# Patient Record
Sex: Female | Born: 1985 | Race: White | Hispanic: No | Marital: Married | Smoking: Never smoker
Health system: Southern US, Community
[De-identification: ages and names within clinical notes are randomized; demographics above are authoritative.]

## PROBLEM LIST (undated history)

## (undated) DIAGNOSIS — D6852 Prothrombin gene mutation: Secondary | ICD-10-CM

## (undated) HISTORY — PX: TONSILLECTOMY: SUR1361

## (undated) HISTORY — PX: ARTHROSCOPIC REPAIR ACL: SUR80

## (undated) HISTORY — PX: FOOT SURGERY: SHX648

---

## 2018-01-09 DIAGNOSIS — Z30431 Encounter for routine checking of intrauterine contraceptive device: Secondary | ICD-10-CM | POA: Diagnosis not present

## 2018-01-25 DIAGNOSIS — F430 Acute stress reaction: Secondary | ICD-10-CM | POA: Diagnosis not present

## 2018-02-06 DIAGNOSIS — F430 Acute stress reaction: Secondary | ICD-10-CM | POA: Diagnosis not present

## 2018-03-20 DIAGNOSIS — F430 Acute stress reaction: Secondary | ICD-10-CM | POA: Diagnosis not present

## 2018-03-27 DIAGNOSIS — F430 Acute stress reaction: Secondary | ICD-10-CM | POA: Diagnosis not present

## 2018-04-26 DIAGNOSIS — Z7721 Contact with and (suspected) exposure to potentially hazardous body fluids: Secondary | ICD-10-CM | POA: Diagnosis not present

## 2018-05-12 DIAGNOSIS — Z1389 Encounter for screening for other disorder: Secondary | ICD-10-CM | POA: Diagnosis not present

## 2018-05-12 DIAGNOSIS — R635 Abnormal weight gain: Secondary | ICD-10-CM | POA: Diagnosis not present

## 2018-05-12 DIAGNOSIS — Z6823 Body mass index (BMI) 23.0-23.9, adult: Secondary | ICD-10-CM | POA: Diagnosis not present

## 2018-05-12 DIAGNOSIS — Z205 Contact with and (suspected) exposure to viral hepatitis: Secondary | ICD-10-CM | POA: Diagnosis not present

## 2018-06-27 ENCOUNTER — Other Ambulatory Visit: Payer: Self-pay | Admitting: Radiology

## 2018-06-27 DIAGNOSIS — N6311 Unspecified lump in the right breast, upper outer quadrant: Secondary | ICD-10-CM

## 2018-07-04 ENCOUNTER — Ambulatory Visit
Admission: RE | Admit: 2018-07-04 | Discharge: 2018-07-04 | Disposition: A | Discharge status: Home / Self Care | Payer: BLUE CROSS/BLUE SHIELD | Source: Ambulatory Visit | Attending: Radiology | Admitting: Radiology

## 2018-07-04 ENCOUNTER — Other Ambulatory Visit: Payer: Self-pay

## 2018-07-04 DIAGNOSIS — N6311 Unspecified lump in the right breast, upper outer quadrant: Secondary | ICD-10-CM

## 2018-07-05 ENCOUNTER — Other Ambulatory Visit: Payer: Self-pay

## 2018-07-06 ENCOUNTER — Other Ambulatory Visit: Payer: Self-pay

## 2019-01-20 IMAGING — MG DIGITAL DIAGNOSTIC BILATERAL MAMMOGRAM WITH TOMO AND CAD
8 series · 8 of 20 positions shown · non-contrast
Comparison: Outside ultrasound exams dated 01/10/2017 and
05/24/2016 no previous mammogram.

CLINICAL DATA: Ordering physician describes palpable lumps within
the upper-outer quadrant of the RIGHT breast, corresponding to an
area of recent breast pain. Patient states that there is no pain in
this area today.

This is patient's baseline mammogram.
EXAM:
DIGITAL DIAGNOSTIC BILATERAL MAMMOGRAM WITH CAD AND TOMO
ULTRASOUND RIGHT BREAST

[R CC synth-2D]
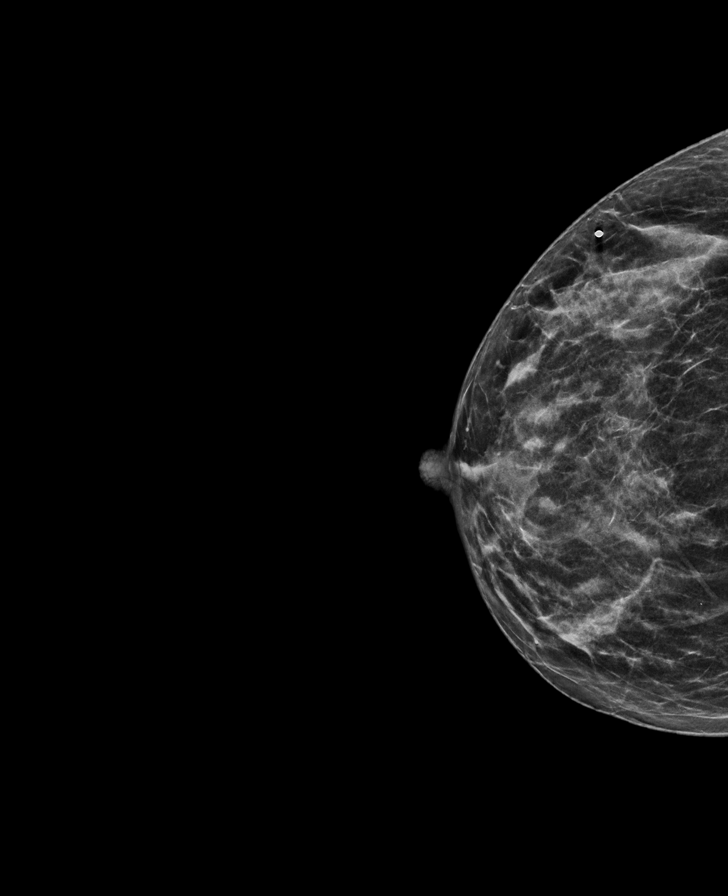

[L MLO synth-2D (1 of 2)]
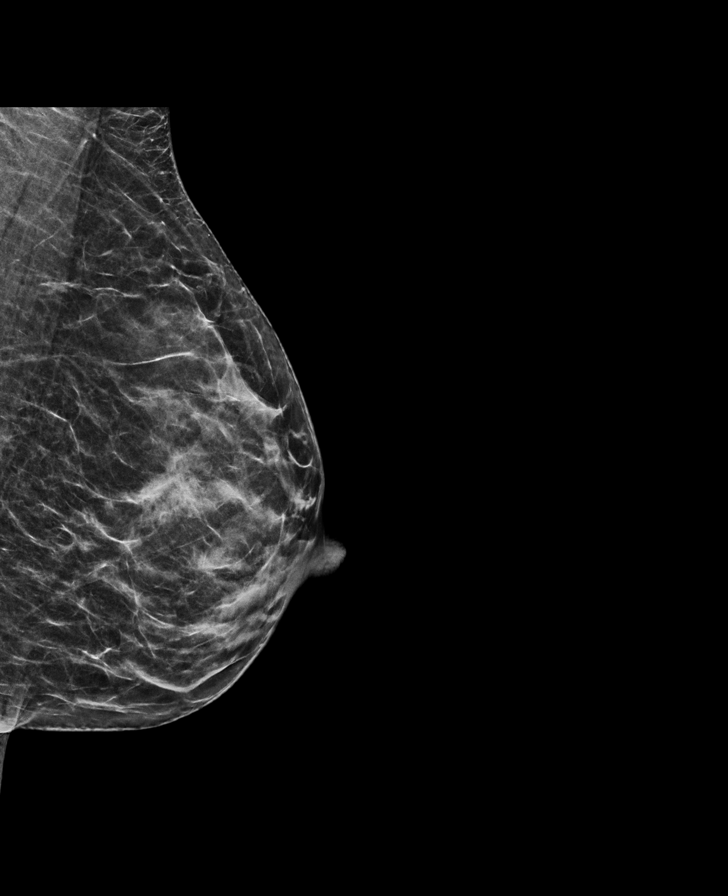

[R MLO synth-2D]
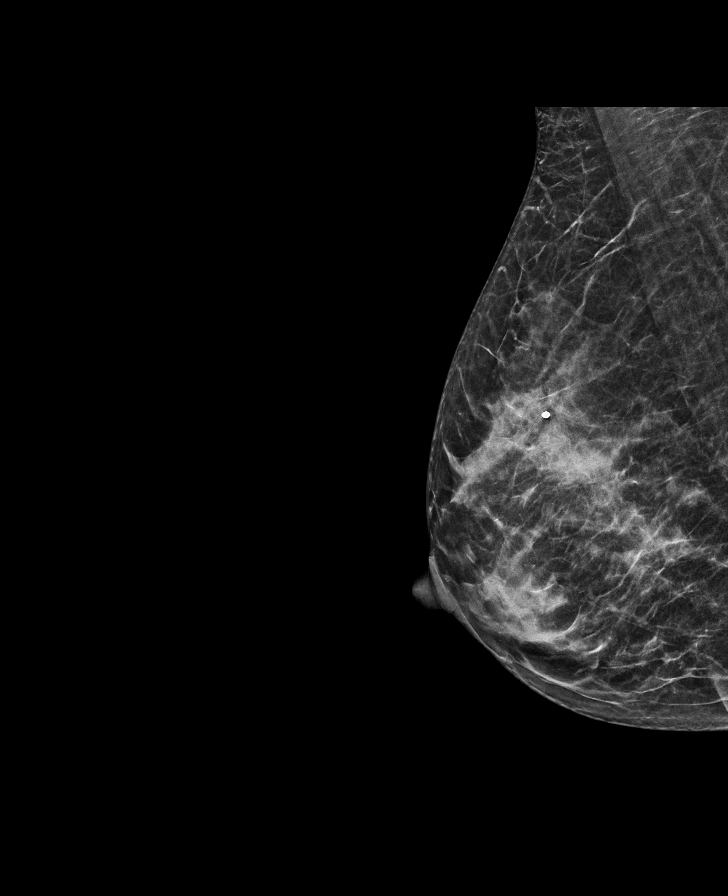

[L CC synth-2D]
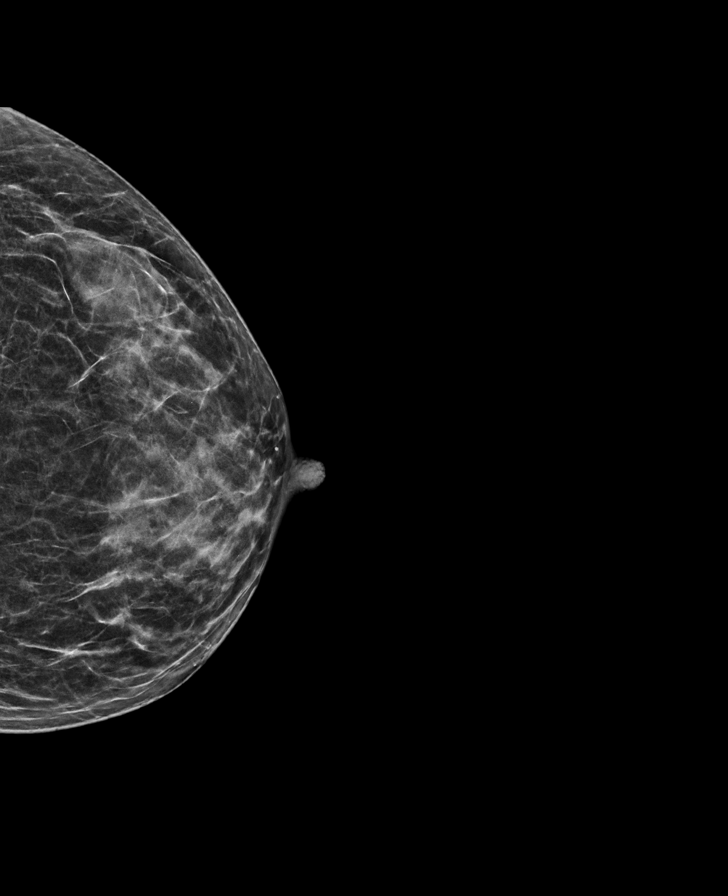

[L MLO synth-2D (2 of 2)]
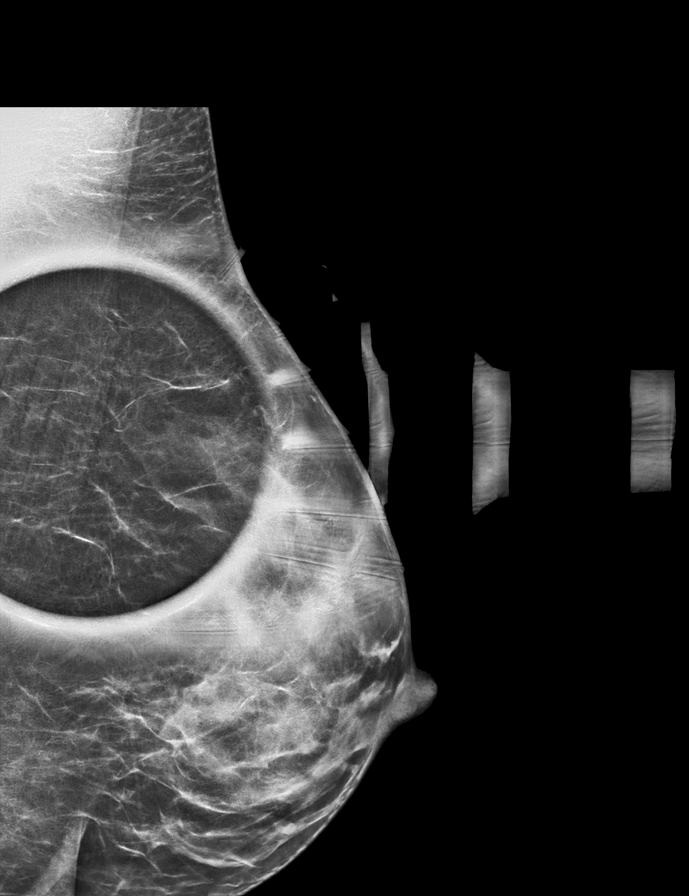

[L MLO tomo · tomo slice 27/52.0]
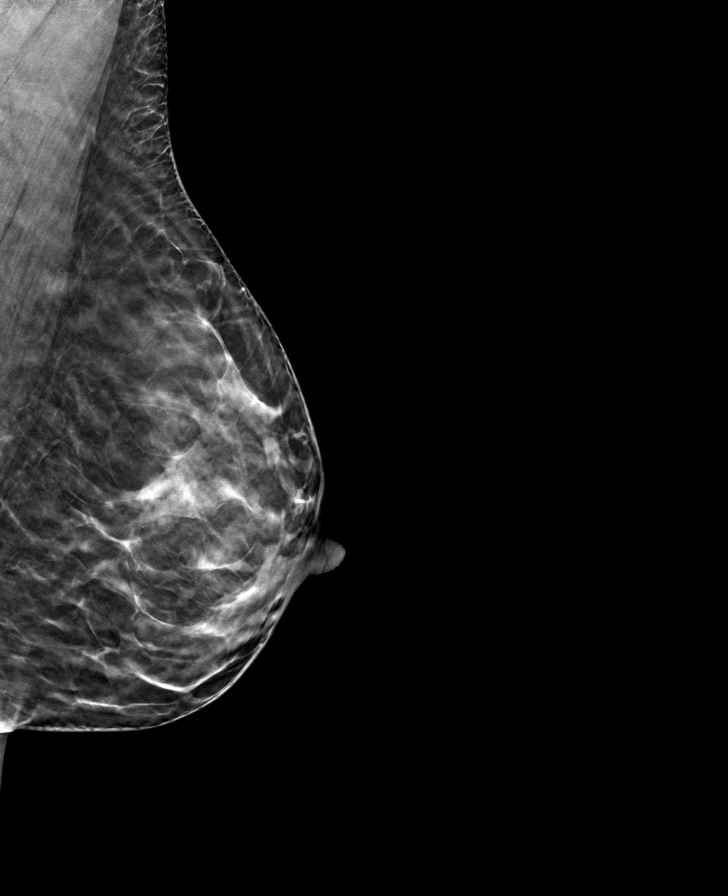

[R MLO tomo · tomo slice 27/53.0]
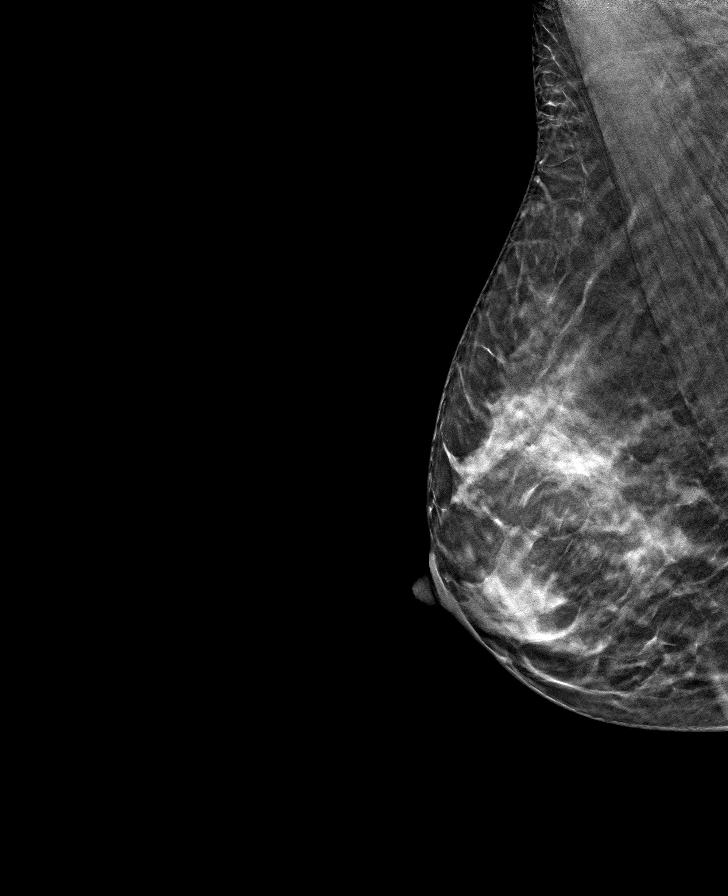

[R TAN tomo · tomo slice 21/42.0]
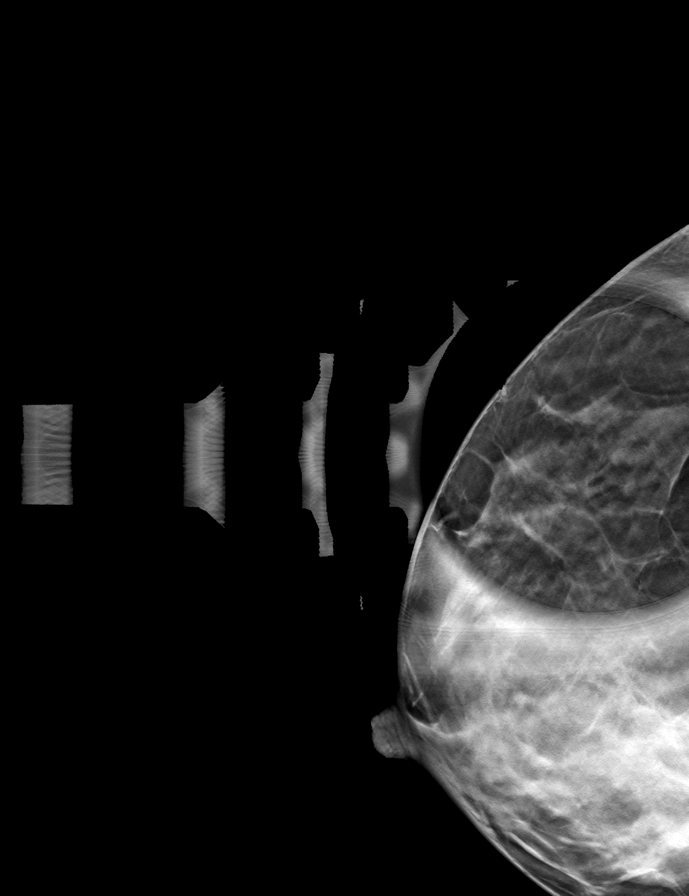

[8 of 20 positions shown; findings below may reference images not displayed]

ACR Breast Density Category c: The breast tissue is heterogeneously
dense, which may obscure small masses.
FINDINGS: There are no suspicious masses, suspicious calcifications or
secondary signs of malignancy within either breast. Specifically,
there is no mammographic abnormality within the upper-outer quadrant
of the RIGHT breast corresponding to the area of clinical concern.

Mammographic images were processed with CAD.

Targeted ultrasound is performed, evaluating the outer RIGHT breast
as directed by the patient, showing a ridge of normal dense
fibroglandular tissue at the 9:30 o'clock axis. No solid or cystic
mass is identified within the outer RIGHT breast.
IMPRESSION: No evidence of malignancy within either breast. Ridge of normal
dense fibroglandular tissue within the outer RIGHT breast,
corresponding to the area of clinical concern.

RECOMMENDATION:
1. Screening mammogram at age 40 unless there are persistent or
intervening clinical concerns. (Code:[id number])
2. The patient was instructed to return sooner if the area that she
feels becomes larger and/or firmer to palpation, or if a new
palpable abnormality is identified in either breast.
3. Benign causes of breast pain, and possible remedies, were
discussed with the patient.

I have discussed the findings and recommendations with the patient.
Results were also provided in writing at the conclusion of the
visit. If applicable, a reminder letter will be sent to the patient
regarding the next appointment.

BI-RADS CATEGORY  1: Negative.

## 2019-06-06 ENCOUNTER — Encounter: Payer: Self-pay | Admitting: *Deleted

## 2020-10-03 ENCOUNTER — Other Ambulatory Visit: Payer: BLUE CROSS/BLUE SHIELD

## 2021-11-24 DIAGNOSIS — Z30432 Encounter for removal of intrauterine contraceptive device: Secondary | ICD-10-CM | POA: Diagnosis not present

## 2021-12-11 DIAGNOSIS — H5213 Myopia, bilateral: Secondary | ICD-10-CM | POA: Diagnosis not present

## 2021-12-11 DIAGNOSIS — H1789 Other corneal scars and opacities: Secondary | ICD-10-CM | POA: Diagnosis not present

## 2021-12-23 DIAGNOSIS — Z01419 Encounter for gynecological examination (general) (routine) without abnormal findings: Secondary | ICD-10-CM | POA: Diagnosis not present

## 2021-12-23 DIAGNOSIS — N912 Amenorrhea, unspecified: Secondary | ICD-10-CM | POA: Diagnosis not present

## 2021-12-23 DIAGNOSIS — Z6824 Body mass index (BMI) 24.0-24.9, adult: Secondary | ICD-10-CM | POA: Diagnosis not present

## 2022-01-04 DIAGNOSIS — M791 Myalgia, unspecified site: Secondary | ICD-10-CM | POA: Diagnosis not present

## 2022-01-04 DIAGNOSIS — M2011 Hallux valgus (acquired), right foot: Secondary | ICD-10-CM | POA: Diagnosis not present

## 2022-01-15 DIAGNOSIS — N911 Secondary amenorrhea: Secondary | ICD-10-CM | POA: Diagnosis not present

## 2022-01-25 DIAGNOSIS — R7989 Other specified abnormal findings of blood chemistry: Secondary | ICD-10-CM | POA: Diagnosis not present

## 2022-01-25 DIAGNOSIS — Z0000 Encounter for general adult medical examination without abnormal findings: Secondary | ICD-10-CM | POA: Diagnosis not present

## 2022-02-01 DIAGNOSIS — Z0000 Encounter for general adult medical examination without abnormal findings: Secondary | ICD-10-CM | POA: Diagnosis not present

## 2022-02-01 DIAGNOSIS — Z8249 Family history of ischemic heart disease and other diseases of the circulatory system: Secondary | ICD-10-CM | POA: Diagnosis not present

## 2022-02-01 DIAGNOSIS — R82998 Other abnormal findings in urine: Secondary | ICD-10-CM | POA: Diagnosis not present

## 2022-02-02 DIAGNOSIS — Z3481 Encounter for supervision of other normal pregnancy, first trimester: Secondary | ICD-10-CM | POA: Diagnosis not present

## 2022-02-02 DIAGNOSIS — Z3685 Encounter for antenatal screening for Streptococcus B: Secondary | ICD-10-CM | POA: Diagnosis not present

## 2022-02-02 LAB — HEPATITIS C ANTIBODY: HCV Ab: NEGATIVE

## 2022-02-02 LAB — OB RESULTS CONSOLE ABO/RH: RH Type: NEGATIVE

## 2022-02-02 LAB — OB RESULTS CONSOLE RUBELLA ANTIBODY, IGM: Rubella: IMMUNE

## 2022-02-02 LAB — OB RESULTS CONSOLE ANTIBODY SCREEN: Antibody Screen: NEGATIVE

## 2022-02-02 LAB — OB RESULTS CONSOLE HEPATITIS B SURFACE ANTIGEN: Hepatitis B Surface Ag: NEGATIVE

## 2022-02-02 LAB — OB RESULTS CONSOLE HIV ANTIBODY (ROUTINE TESTING): HIV: NONREACTIVE

## 2022-02-02 LAB — OB RESULTS CONSOLE RPR: RPR: NONREACTIVE

## 2022-02-18 DIAGNOSIS — Z3A11 11 weeks gestation of pregnancy: Secondary | ICD-10-CM | POA: Diagnosis not present

## 2022-02-18 DIAGNOSIS — Z113 Encounter for screening for infections with a predominantly sexual mode of transmission: Secondary | ICD-10-CM | POA: Diagnosis not present

## 2022-02-18 DIAGNOSIS — Z3400 Encounter for supervision of normal first pregnancy, unspecified trimester: Secondary | ICD-10-CM | POA: Diagnosis not present

## 2022-02-18 LAB — OB RESULTS CONSOLE GC/CHLAMYDIA
Chlamydia: NEGATIVE
Neisseria Gonorrhea: NEGATIVE

## 2022-03-01 DIAGNOSIS — Z3682 Encounter for antenatal screening for nuchal translucency: Secondary | ICD-10-CM | POA: Diagnosis not present

## 2022-03-01 DIAGNOSIS — Z3A12 12 weeks gestation of pregnancy: Secondary | ICD-10-CM | POA: Diagnosis not present

## 2022-03-01 DIAGNOSIS — O09529 Supervision of elderly multigravida, unspecified trimester: Secondary | ICD-10-CM | POA: Diagnosis not present

## 2022-04-13 DIAGNOSIS — Z363 Encounter for antenatal screening for malformations: Secondary | ICD-10-CM | POA: Diagnosis not present

## 2022-04-13 DIAGNOSIS — Z3A19 19 weeks gestation of pregnancy: Secondary | ICD-10-CM | POA: Diagnosis not present

## 2022-04-13 DIAGNOSIS — Z3682 Encounter for antenatal screening for nuchal translucency: Secondary | ICD-10-CM | POA: Diagnosis not present

## 2022-04-13 DIAGNOSIS — O09522 Supervision of elderly multigravida, second trimester: Secondary | ICD-10-CM | POA: Diagnosis not present

## 2022-04-19 DIAGNOSIS — N760 Acute vaginitis: Secondary | ICD-10-CM | POA: Diagnosis not present

## 2022-05-04 DIAGNOSIS — N760 Acute vaginitis: Secondary | ICD-10-CM | POA: Diagnosis not present

## 2022-05-19 DIAGNOSIS — Z862 Personal history of diseases of the blood and blood-forming organs and certain disorders involving the immune mechanism: Secondary | ICD-10-CM | POA: Diagnosis not present

## 2022-06-16 DIAGNOSIS — Z362 Encounter for other antenatal screening follow-up: Secondary | ICD-10-CM | POA: Diagnosis not present

## 2022-06-16 DIAGNOSIS — Z3480 Encounter for supervision of other normal pregnancy, unspecified trimester: Secondary | ICD-10-CM | POA: Diagnosis not present

## 2022-06-16 DIAGNOSIS — O360110 Maternal care for anti-D [Rh] antibodies, first trimester, not applicable or unspecified: Secondary | ICD-10-CM | POA: Diagnosis not present

## 2022-06-16 DIAGNOSIS — Z3A28 28 weeks gestation of pregnancy: Secondary | ICD-10-CM | POA: Diagnosis not present

## 2022-06-16 DIAGNOSIS — O368930 Maternal care for other specified fetal problems, third trimester, not applicable or unspecified: Secondary | ICD-10-CM | POA: Diagnosis not present

## 2022-06-21 DIAGNOSIS — O99810 Abnormal glucose complicating pregnancy: Secondary | ICD-10-CM | POA: Diagnosis not present

## 2022-07-05 DIAGNOSIS — Z862 Personal history of diseases of the blood and blood-forming organs and certain disorders involving the immune mechanism: Secondary | ICD-10-CM | POA: Diagnosis not present

## 2022-07-05 DIAGNOSIS — Z23 Encounter for immunization: Secondary | ICD-10-CM | POA: Diagnosis not present

## 2022-08-04 DIAGNOSIS — L858 Other specified epidermal thickening: Secondary | ICD-10-CM | POA: Diagnosis not present

## 2022-08-04 DIAGNOSIS — O09523 Supervision of elderly multigravida, third trimester: Secondary | ICD-10-CM | POA: Diagnosis not present

## 2022-08-04 DIAGNOSIS — D1801 Hemangioma of skin and subcutaneous tissue: Secondary | ICD-10-CM | POA: Diagnosis not present

## 2022-08-04 DIAGNOSIS — D2262 Melanocytic nevi of left upper limb, including shoulder: Secondary | ICD-10-CM | POA: Diagnosis not present

## 2022-08-04 DIAGNOSIS — Z3A35 35 weeks gestation of pregnancy: Secondary | ICD-10-CM | POA: Diagnosis not present

## 2022-08-04 DIAGNOSIS — O34211 Maternal care for low transverse scar from previous cesarean delivery: Secondary | ICD-10-CM | POA: Diagnosis not present

## 2022-08-04 DIAGNOSIS — L814 Other melanin hyperpigmentation: Secondary | ICD-10-CM | POA: Diagnosis not present

## 2022-08-13 DIAGNOSIS — Z3682 Encounter for antenatal screening for nuchal translucency: Secondary | ICD-10-CM | POA: Diagnosis not present

## 2022-08-13 DIAGNOSIS — Z862 Personal history of diseases of the blood and blood-forming organs and certain disorders involving the immune mechanism: Secondary | ICD-10-CM | POA: Diagnosis not present

## 2022-08-13 LAB — OB RESULTS CONSOLE GBS: GBS: NEGATIVE

## 2022-08-18 ENCOUNTER — Encounter (HOSPITAL_COMMUNITY): Payer: Self-pay | Admitting: *Deleted

## 2022-08-18 NOTE — Patient Instructions (Signed)
Megan Henson  08/18/2022   Your procedure is scheduled on:  08/31/2022  Arrive at [place] at Entrance C on [address] at [facility]  and [employer]. You are invited to use the FREE valet parking or use the Visitor's parking deck.  Pick up the phone at the desk and dial [phone].  Call this number if you have problems the morning of surgery: [phone]  Remember:   Do not eat food:(After Midnight) Desps de medianoche.  Do not drink clear liquids: (After Midnight) Desps de medianoche.  Take these medicines the morning of surgery with A SIP OF WATER:  none   Do not wear jewelry, make-up or nail polish.  Do not wear lotions, powders, or perfumes. Do not wear deodorant.  Do not shave 48 hours prior to surgery.  Do not bring valuables to the hospital.  [facility] is not   responsible for any belongings or valuables brought to the hospital.  Contacts, dentures or bridgework may not be worn into surgery.  Leave suitcase in the car. After surgery it may be brought to your room.  For patients admitted to the hospital, checkout time is 11:00 AM the day of              discharge.      Please read over the following fact sheets that you were given:     Preparing for Surgery

## 2022-08-19 ENCOUNTER — Encounter (HOSPITAL_COMMUNITY): Payer: Self-pay

## 2022-08-19 ENCOUNTER — Telehealth (HOSPITAL_COMMUNITY): Payer: Self-pay | Admitting: *Deleted

## 2022-08-19 NOTE — Telephone Encounter (Signed)
Preadmission screen  

## 2022-08-24 DIAGNOSIS — Z862 Personal history of diseases of the blood and blood-forming organs and certain disorders involving the immune mechanism: Secondary | ICD-10-CM | POA: Diagnosis not present

## 2022-08-25 DIAGNOSIS — Z862 Personal history of diseases of the blood and blood-forming organs and certain disorders involving the immune mechanism: Secondary | ICD-10-CM | POA: Diagnosis not present

## 2022-08-30 ENCOUNTER — Encounter (HOSPITAL_COMMUNITY)
Admission: RE | Admit: 2022-08-30 | Discharge: 2022-08-30 | Disposition: A | Discharge status: Home / Self Care | Payer: BC Managed Care – PPO | Source: Ambulatory Visit | Attending: Obstetrics and Gynecology | Admitting: Obstetrics and Gynecology

## 2022-08-30 DIAGNOSIS — O9081 Anemia of the puerperium: Secondary | ICD-10-CM | POA: Diagnosis not present

## 2022-08-30 DIAGNOSIS — O165 Unspecified maternal hypertension, complicating the puerperium: Secondary | ICD-10-CM | POA: Diagnosis not present

## 2022-08-30 DIAGNOSIS — O1495 Unspecified pre-eclampsia, complicating the puerperium: Secondary | ICD-10-CM | POA: Diagnosis not present

## 2022-08-30 DIAGNOSIS — Z01818 Encounter for other preprocedural examination: Secondary | ICD-10-CM | POA: Insufficient documentation

## 2022-08-30 DIAGNOSIS — Z302 Encounter for sterilization: Secondary | ICD-10-CM | POA: Diagnosis not present

## 2022-08-30 DIAGNOSIS — D62 Acute posthemorrhagic anemia: Secondary | ICD-10-CM | POA: Diagnosis not present

## 2022-08-30 DIAGNOSIS — O34211 Maternal care for low transverse scar from previous cesarean delivery: Secondary | ICD-10-CM | POA: Diagnosis not present

## 2022-08-30 DIAGNOSIS — D6852 Prothrombin gene mutation: Secondary | ICD-10-CM | POA: Diagnosis not present

## 2022-08-30 DIAGNOSIS — R519 Headache, unspecified: Secondary | ICD-10-CM | POA: Diagnosis not present

## 2022-08-30 DIAGNOSIS — O1490 Unspecified pre-eclampsia, unspecified trimester: Secondary | ICD-10-CM | POA: Diagnosis not present

## 2022-08-30 DIAGNOSIS — O1415 Severe pre-eclampsia, complicating the puerperium: Secondary | ICD-10-CM | POA: Diagnosis not present

## 2022-08-30 DIAGNOSIS — Z3A39 39 weeks gestation of pregnancy: Secondary | ICD-10-CM | POA: Insufficient documentation

## 2022-08-30 DIAGNOSIS — O9912 Other diseases of the blood and blood-forming organs and certain disorders involving the immune mechanism complicating childbirth: Secondary | ICD-10-CM | POA: Diagnosis not present

## 2022-08-30 DIAGNOSIS — Z79899 Other long term (current) drug therapy: Secondary | ICD-10-CM | POA: Diagnosis not present

## 2022-08-30 DIAGNOSIS — O99113 Other diseases of the blood and blood-forming organs and certain disorders involving the immune mechanism complicating pregnancy, third trimester: Secondary | ICD-10-CM | POA: Diagnosis not present

## 2022-08-30 DIAGNOSIS — O285 Abnormal chromosomal and genetic finding on antenatal screening of mother: Secondary | ICD-10-CM | POA: Diagnosis not present

## 2022-08-30 DIAGNOSIS — Z98891 History of uterine scar from previous surgery: Secondary | ICD-10-CM

## 2022-08-30 HISTORY — DX: Prothrombin gene mutation: D68.52

## 2022-08-30 LAB — RPR: RPR Ser Ql: NONREACTIVE

## 2022-08-30 LAB — TYPE AND SCREEN
ABO/RH(D): O NEG
Antibody Screen: POSITIVE

## 2022-08-30 LAB — CBC
HCT: 35.3 % — ABNORMAL LOW (ref 36.0–46.0)
Hemoglobin: 12.2 g/dL (ref 12.0–15.0)
MCH: 31.2 pg (ref 26.0–34.0)
MCHC: 34.6 g/dL (ref 30.0–36.0)
MCV: 90.3 fL (ref 80.0–100.0)
Platelets: 119 10*3/uL — ABNORMAL LOW (ref 150–400)
RBC: 3.91 MIL/uL (ref 3.87–5.11)
RDW: 13.2 % (ref 11.5–15.5)
WBC: 7 10*3/uL (ref 4.0–10.5)
nRBC: 0 % (ref 0.0–0.2)

## 2022-08-30 NOTE — Anesthesia Preprocedure Evaluation (Signed)
Anesthesia Evaluation  Patient identified by MRN, date of birth, ID band Patient awake    Reviewed: Allergy & Precautions, NPO status , Patient's Chart, lab work & pertinent test results  Airway Mallampati: I  TM Distance: >3 FB Neck ROM: Full    Dental no notable dental hx. 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 Has had two prior neuraxial procedures with other deliveries. Requires lovenox post op, but not on anticoagulants otherwise.    Anesthesia Other Findings   Reproductive/Obstetrics (+) Pregnancy                            Anesthesia Physical Anesthesia Plan  ASA: 2  Anesthesia Plan: Spinal   Post-op Pain Management: Toradol IV (intra-op)* and Tylenol PO (pre-op)*   Induction:   PONV Risk Score and Plan: 4 or greater and Ondansetron, Treatment may vary due to age or medical condition, Dexamethasone and Scopolamine patch - Pre-op  Airway Management Planned: Natural Airway  Additional Equipment:   Intra-op Plan:   Post-operative Plan:   Informed Consent: I have reviewed the patients History and Physical, chart, labs and discussed the procedure including the risks, benefits and alternatives for the proposed anesthesia with the patient or authorized representative who has indicated his/her understanding and acceptance.     Dental advisory given  Plan Discussed with: CRNA  Anesthesia Plan  Comments:         Anesthesia Quick Evaluation

## 2022-08-31 ENCOUNTER — Other Ambulatory Visit: Payer: Self-pay

## 2022-08-31 ENCOUNTER — Encounter (HOSPITAL_COMMUNITY)
Admission: RE | Disposition: A | Discharge status: Home / Self Care | Payer: Self-pay | Source: Home / Self Care | Attending: Obstetrics and Gynecology

## 2022-08-31 ENCOUNTER — Inpatient Hospital Stay (HOSPITAL_COMMUNITY)
Admission: RE | Admit: 2022-08-31 | Discharge: 2022-09-02 | DRG: 784 | Disposition: A | Discharge status: Home / Self Care | Payer: BC Managed Care – PPO | Attending: Obstetrics and Gynecology | Admitting: Obstetrics and Gynecology

## 2022-08-31 ENCOUNTER — Inpatient Hospital Stay (HOSPITAL_COMMUNITY): Payer: BC Managed Care – PPO | Admitting: Anesthesiology

## 2022-08-31 ENCOUNTER — Encounter (HOSPITAL_COMMUNITY): Payer: Self-pay | Admitting: Obstetrics and Gynecology

## 2022-08-31 DIAGNOSIS — O1415 Severe pre-eclampsia, complicating the puerperium: Secondary | ICD-10-CM | POA: Diagnosis not present

## 2022-08-31 DIAGNOSIS — Z3490 Encounter for supervision of normal pregnancy, unspecified, unspecified trimester: Secondary | ICD-10-CM

## 2022-08-31 DIAGNOSIS — Z79899 Other long term (current) drug therapy: Secondary | ICD-10-CM | POA: Diagnosis not present

## 2022-08-31 DIAGNOSIS — D6852 Prothrombin gene mutation: Secondary | ICD-10-CM | POA: Diagnosis present

## 2022-08-31 DIAGNOSIS — Z98891 History of uterine scar from previous surgery: Secondary | ICD-10-CM

## 2022-08-31 DIAGNOSIS — O1495 Unspecified pre-eclampsia, complicating the puerperium: Secondary | ICD-10-CM | POA: Diagnosis not present

## 2022-08-31 DIAGNOSIS — O9081 Anemia of the puerperium: Secondary | ICD-10-CM | POA: Diagnosis not present

## 2022-08-31 DIAGNOSIS — D62 Acute posthemorrhagic anemia: Secondary | ICD-10-CM | POA: Diagnosis not present

## 2022-08-31 DIAGNOSIS — O34211 Maternal care for low transverse scar from previous cesarean delivery: Principal | ICD-10-CM | POA: Diagnosis present

## 2022-08-31 DIAGNOSIS — O9912 Other diseases of the blood and blood-forming organs and certain disorders involving the immune mechanism complicating childbirth: Secondary | ICD-10-CM | POA: Diagnosis present

## 2022-08-31 DIAGNOSIS — Z3A39 39 weeks gestation of pregnancy: Secondary | ICD-10-CM | POA: Diagnosis not present

## 2022-08-31 DIAGNOSIS — O1490 Unspecified pre-eclampsia, unspecified trimester: Secondary | ICD-10-CM | POA: Diagnosis not present

## 2022-08-31 DIAGNOSIS — O165 Unspecified maternal hypertension, complicating the puerperium: Secondary | ICD-10-CM | POA: Diagnosis present

## 2022-08-31 DIAGNOSIS — R519 Headache, unspecified: Secondary | ICD-10-CM | POA: Diagnosis not present

## 2022-08-31 DIAGNOSIS — Z302 Encounter for sterilization: Secondary | ICD-10-CM

## 2022-08-31 LAB — ABO/RH: ABO/RH(D): O NEG

## 2022-08-31 SURGERY — Surgical Case
Anesthesia: Spinal

## 2022-08-31 MED ORDER — SCOPOLAMINE 1 MG/3DAYS TD PT72: 1.0000 | MEDICATED_PATCH | Freq: Once | TRANSDERMAL | Status: DC

## 2022-08-31 MED ORDER — LACTATED RINGERS IV SOLN: INTRAVENOUS | Status: DC

## 2022-08-31 MED ORDER — PHENYLEPHRINE HCL-NACL 20-0.9 MG/250ML-% IV SOLN
INTRAVENOUS | Status: DC | PRN
  Administered 2022-08-31: 60 ug/min via INTRAVENOUS

## 2022-08-31 MED ORDER — MORPHINE SULFATE (PF) 0.5 MG/ML IJ SOLN
INTRAMUSCULAR | Status: AC
  Filled 2022-08-31: qty 10

## 2022-08-31 MED ORDER — ENOXAPARIN SODIUM 40 MG/0.4ML IJ SOSY
40.0000 mg | PREFILLED_SYRINGE | INTRAMUSCULAR | Status: DC
  Administered 2022-09-01 – 2022-09-02 (×2): 40 mg via SUBCUTANEOUS
  Filled 2022-08-31 (×2): qty 0.4

## 2022-08-31 MED ORDER — SODIUM CHLORIDE 0.9% FLUSH: 3.0000 mL | INTRAVENOUS | Status: DC | PRN

## 2022-08-31 MED ORDER — SIMETHICONE 80 MG PO CHEW
80.0000 mg | CHEWABLE_TABLET | Freq: Three times a day (TID) | ORAL | Status: DC
  Administered 2022-08-31 – 2022-09-02 (×5): 80 mg via ORAL
  Filled 2022-08-31 (×5): qty 1

## 2022-08-31 MED ORDER — SCOPOLAMINE 1 MG/3DAYS TD PT72
1.0000 | MEDICATED_PATCH | Freq: Once | TRANSDERMAL | Status: DC
  Administered 2022-08-31: 1.5 mg via TRANSDERMAL

## 2022-08-31 MED ORDER — OXYTOCIN-SODIUM CHLORIDE 30-0.9 UT/500ML-% IV SOLN
INTRAVENOUS | Status: DC | PRN
  Administered 2022-08-31: 300 mL via INTRAVENOUS

## 2022-08-31 MED ORDER — ACETAMINOPHEN 325 MG PO TABS
650.0000 mg | ORAL_TABLET | ORAL | Status: DC | PRN
  Administered 2022-08-31 – 2022-09-02 (×6): 650 mg via ORAL
  Filled 2022-08-31 (×6): qty 2

## 2022-08-31 MED ORDER — ACETAMINOPHEN 500 MG PO TABS
1000.0000 mg | ORAL_TABLET | Freq: Once | ORAL | Status: AC
  Administered 2022-08-31: 1000 mg via ORAL

## 2022-08-31 MED ORDER — COCONUT OIL OIL: 1.0000 | TOPICAL_OIL | Status: DC | PRN

## 2022-08-31 MED ORDER — OXYCODONE HCL 5 MG PO TABS: 5.0000 mg | ORAL_TABLET | ORAL | Status: DC | PRN

## 2022-08-31 MED ORDER — SENNOSIDES-DOCUSATE SODIUM 8.6-50 MG PO TABS
2.0000 | ORAL_TABLET | Freq: Every day | ORAL | Status: DC
  Administered 2022-09-01: 2 via ORAL
  Filled 2022-08-31: qty 2

## 2022-08-31 MED ORDER — SODIUM CHLORIDE (PF) 0.9 % IJ SOLN
INTRAMUSCULAR | Status: AC
  Filled 2022-08-31: qty 50

## 2022-08-31 MED ORDER — PROMETHAZINE HCL 25 MG/ML IJ SOLN: 6.2500 mg | INTRAMUSCULAR | Status: DC | PRN

## 2022-08-31 MED ORDER — OXYTOCIN-SODIUM CHLORIDE 30-0.9 UT/500ML-% IV SOLN
INTRAVENOUS | Status: AC
  Filled 2022-08-31: qty 500

## 2022-08-31 MED ORDER — KETOROLAC TROMETHAMINE 30 MG/ML IJ SOLN
30.0000 mg | Freq: Once | INTRAMUSCULAR | Status: AC
  Administered 2022-08-31: 30 mg via INTRAVENOUS
  Filled 2022-08-31: qty 1

## 2022-08-31 MED ORDER — KETOROLAC TROMETHAMINE 30 MG/ML IJ SOLN
INTRAMUSCULAR | Status: DC | PRN
  Administered 2022-08-31: 30 mg via INTRAVENOUS

## 2022-08-31 MED ORDER — MEPERIDINE HCL 25 MG/ML IJ SOLN: 6.2500 mg | INTRAMUSCULAR | Status: DC | PRN

## 2022-08-31 MED ORDER — DIBUCAINE (PERIANAL) 1 % EX OINT: 1.0000 | TOPICAL_OINTMENT | CUTANEOUS | Status: DC | PRN

## 2022-08-31 MED ORDER — NALOXONE HCL 0.4 MG/ML IJ SOLN: 0.4000 mg | INTRAMUSCULAR | Status: DC | PRN

## 2022-08-31 MED ORDER — IBUPROFEN 600 MG PO TABS
600.0000 mg | ORAL_TABLET | Freq: Four times a day (QID) | ORAL | Status: DC
  Administered 2022-08-31 – 2022-09-02 (×6): 600 mg via ORAL
  Filled 2022-08-31 (×6): qty 1

## 2022-08-31 MED ORDER — STERILE WATER FOR IRRIGATION IR SOLN
Status: DC | PRN
  Administered 2022-08-31: 1000 mL

## 2022-08-31 MED ORDER — DIPHENHYDRAMINE HCL 25 MG PO CAPS: 25.0000 mg | ORAL_CAPSULE | ORAL | Status: DC | PRN

## 2022-08-31 MED ORDER — POVIDONE-IODINE 10 % EX SWAB: 2.0000 | Freq: Once | CUTANEOUS | Status: DC

## 2022-08-31 MED ORDER — ACETAMINOPHEN 500 MG PO TABS
ORAL_TABLET | ORAL | Status: AC
  Filled 2022-08-31: qty 2

## 2022-08-31 MED ORDER — RHO D IMMUNE GLOBULIN 1500 UNIT/2ML IJ SOSY
300.0000 ug | PREFILLED_SYRINGE | Freq: Once | INTRAMUSCULAR | Status: AC
  Administered 2022-09-01: 300 ug via INTRAVENOUS
  Filled 2022-08-31: qty 2

## 2022-08-31 MED ORDER — SIMETHICONE 80 MG PO CHEW: 80.0000 mg | CHEWABLE_TABLET | ORAL | Status: DC | PRN

## 2022-08-31 MED ORDER — BUPIVACAINE LIPOSOME 1.3 % IJ SUSP
INTRAMUSCULAR | Status: AC
  Filled 2022-08-31: qty 20

## 2022-08-31 MED ORDER — HYDROMORPHONE HCL 1 MG/ML IJ SOLN: 0.2500 mg | INTRAMUSCULAR | Status: DC | PRN

## 2022-08-31 MED ORDER — ONDANSETRON HCL 4 MG/2ML IJ SOLN
INTRAMUSCULAR | Status: DC | PRN
  Administered 2022-08-31: 4 mg via INTRAVENOUS

## 2022-08-31 MED ORDER — SODIUM CHLORIDE 0.9 % IR SOLN
Status: DC | PRN
  Administered 2022-08-31: 1000 mL via INTRAVESICAL

## 2022-08-31 MED ORDER — ONDANSETRON HCL 4 MG/2ML IJ SOLN
INTRAMUSCULAR | Status: AC
  Filled 2022-08-31: qty 2

## 2022-08-31 MED ORDER — BUPIVACAINE HCL (PF) 0.25 % IJ SOLN
INTRAMUSCULAR | Status: AC
  Filled 2022-08-31: qty 30

## 2022-08-31 MED ORDER — FENTANYL CITRATE (PF) 100 MCG/2ML IJ SOLN
INTRAMUSCULAR | Status: DC | PRN
  Administered 2022-08-31: 15 ug via INTRATHECAL

## 2022-08-31 MED ORDER — FENTANYL CITRATE (PF) 100 MCG/2ML IJ SOLN
INTRAMUSCULAR | Status: AC
  Filled 2022-08-31: qty 2

## 2022-08-31 MED ORDER — MENTHOL 3 MG MT LOZG: 1.0000 | LOZENGE | OROMUCOSAL | Status: DC | PRN

## 2022-08-31 MED ORDER — BUPIVACAINE IN DEXTROSE 0.75-8.25 % IT SOLN
INTRATHECAL | Status: DC | PRN
  Administered 2022-08-31: 1.5 mL via INTRATHECAL

## 2022-08-31 MED ORDER — ONDANSETRON HCL 4 MG/2ML IJ SOLN: 4.0000 mg | Freq: Three times a day (TID) | INTRAMUSCULAR | Status: DC | PRN

## 2022-08-31 MED ORDER — NALOXONE HCL 4 MG/10ML IJ SOLN: 1.0000 ug/kg/h | INTRAVENOUS | Status: DC | PRN

## 2022-08-31 MED ORDER — BUPIVACAINE HCL (PF) 0.25 % IJ SOLN
INTRAMUSCULAR | Status: DC | PRN
  Administered 2022-08-31: 30 mL

## 2022-08-31 MED ORDER — SCOPOLAMINE 1 MG/3DAYS TD PT72
MEDICATED_PATCH | TRANSDERMAL | Status: AC
  Filled 2022-08-31: qty 1

## 2022-08-31 MED ORDER — SODIUM CHLORIDE 0.9 % IV SOLN
INTRAVENOUS | Status: AC
  Filled 2022-08-31: qty 2

## 2022-08-31 MED ORDER — OXYTOCIN-SODIUM CHLORIDE 30-0.9 UT/500ML-% IV SOLN
2.5000 [IU]/h | INTRAVENOUS | Status: AC
  Administered 2022-08-31: 2.5 [IU]/h via INTRAVENOUS
  Filled 2022-08-31: qty 500

## 2022-08-31 MED ORDER — WITCH HAZEL-GLYCERIN EX PADS: 1.0000 | MEDICATED_PAD | CUTANEOUS | Status: DC | PRN

## 2022-08-31 MED ORDER — DIPHENHYDRAMINE HCL 25 MG PO CAPS: 25.0000 mg | ORAL_CAPSULE | Freq: Four times a day (QID) | ORAL | Status: DC | PRN

## 2022-08-31 MED ORDER — KETOROLAC TROMETHAMINE 30 MG/ML IJ SOLN
INTRAMUSCULAR | Status: AC
  Filled 2022-08-31: qty 1

## 2022-08-31 MED ORDER — PRENATAL MULTIVITAMIN CH
1.0000 | ORAL_TABLET | Freq: Every day | ORAL | Status: DC
  Administered 2022-09-01: 1 via ORAL
  Filled 2022-08-31 (×2): qty 1

## 2022-08-31 MED ORDER — SODIUM CHLORIDE 0.9 % IV SOLN
2.0000 g | INTRAVENOUS | Status: AC
  Administered 2022-08-31: 2 g via INTRAVENOUS

## 2022-08-31 MED ORDER — SOD CITRATE-CITRIC ACID 500-334 MG/5ML PO SOLN
ORAL | Status: AC
  Filled 2022-08-31: qty 30

## 2022-08-31 MED ORDER — DEXAMETHASONE SODIUM PHOSPHATE 10 MG/ML IJ SOLN
INTRAMUSCULAR | Status: DC | PRN
  Administered 2022-08-31: 4 mg via INTRAVENOUS

## 2022-08-31 MED ORDER — MORPHINE SULFATE (PF) 0.5 MG/ML IJ SOLN
INTRAMUSCULAR | Status: DC | PRN
  Administered 2022-08-31: 150 ug via INTRATHECAL

## 2022-08-31 MED ORDER — ZOLPIDEM TARTRATE 5 MG PO TABS: 5.0000 mg | ORAL_TABLET | Freq: Every evening | ORAL | Status: DC | PRN

## 2022-08-31 MED ORDER — DEXAMETHASONE SODIUM PHOSPHATE 4 MG/ML IJ SOLN
INTRAMUSCULAR | Status: AC
  Filled 2022-08-31: qty 1

## 2022-08-31 MED ORDER — TETANUS-DIPHTH-ACELL PERTUSSIS 5-2.5-18.5 LF-MCG/0.5 IM SUSY: 0.5000 mL | PREFILLED_SYRINGE | Freq: Once | INTRAMUSCULAR | Status: DC

## 2022-08-31 MED ORDER — CEFAZOLIN SODIUM-DEXTROSE 2-4 GM/100ML-% IV SOLN
INTRAVENOUS | Status: AC
  Filled 2022-08-31: qty 100

## 2022-08-31 MED ORDER — DIPHENHYDRAMINE HCL 50 MG/ML IJ SOLN: 12.5000 mg | INTRAMUSCULAR | Status: DC | PRN

## 2022-08-31 MED ORDER — PHENYLEPHRINE HCL-NACL 20-0.9 MG/250ML-% IV SOLN
INTRAVENOUS | Status: AC
  Filled 2022-08-31: qty 250

## 2022-08-31 MED ORDER — BUPIVACAINE LIPOSOME 1.3 % IJ SUSP
INTRAMUSCULAR | Status: DC | PRN
  Administered 2022-08-31: 20 mL

## 2022-08-31 MED ORDER — SOD CITRATE-CITRIC ACID 500-334 MG/5ML PO SOLN
30.0000 mL | Freq: Once | ORAL | Status: AC
  Administered 2022-08-31: 30 mL via ORAL

## 2022-08-31 SURGICAL SUPPLY — 41 items
APL PRP STRL LF DISP 70% ISPRP (MISCELLANEOUS) ×2
APL SKNCLS STERI-STRIP NONHPOA (GAUZE/BANDAGES/DRESSINGS) ×1
BARRIER ADHS 3X4 INTERCEED (GAUZE/BANDAGES/DRESSINGS) IMPLANT
BENZOIN TINCTURE PRP APPL 2/3 (GAUZE/BANDAGES/DRESSINGS) IMPLANT
BLADE SURG 10 STRL SS (BLADE) IMPLANT
BRR ADH 4X3 ABS CNTRL BYND (GAUZE/BANDAGES/DRESSINGS)
CHLORAPREP W/TINT 26 (MISCELLANEOUS) ×2 IMPLANT
CLAMP UMBILICAL CORD (MISCELLANEOUS) ×1 IMPLANT
CLIP FILSHIE TUBAL LIGA STRL (Clip) IMPLANT
CLOTH BEACON ORANGE TIMEOUT ST (SAFETY) ×1 IMPLANT
DRSG OPSITE POSTOP 4X10 (GAUZE/BANDAGES/DRESSINGS) ×1 IMPLANT
ELECT REM PT RETURN 9FT ADLT (ELECTROSURGICAL) ×1
ELECTRODE REM PT RTRN 9FT ADLT (ELECTROSURGICAL) ×1 IMPLANT
EXTRACTOR VACUUM M CUP 4 TUBE (SUCTIONS) IMPLANT
GAUZE SPONGE 4X4 12PLY STRL LF (GAUZE/BANDAGES/DRESSINGS) IMPLANT
GLOVE BIO SURGEON STRL SZ 6.5 (GLOVE) ×1 IMPLANT
GLOVE BIOGEL PI IND STRL 7.0 (GLOVE) ×1 IMPLANT
GOWN STRL REUS W/TWL LRG LVL3 (GOWN DISPOSABLE) ×2 IMPLANT
KIT ABG SYR 3ML LUER SLIP (SYRINGE) IMPLANT
NDL HYPO 18GX1.5 BLUNT FILL (NEEDLE) IMPLANT
NDL HYPO 25X5/8 SAFETYGLIDE (NEEDLE) ×1 IMPLANT
NEEDLE HYPO 18GX1.5 BLUNT FILL (NEEDLE) ×3 IMPLANT
NEEDLE HYPO 22GX1.5 SAFETY (NEEDLE) IMPLANT
NEEDLE HYPO 25X5/8 SAFETYGLIDE (NEEDLE) ×1 IMPLANT
NS IRRIG 1000ML POUR BTL (IV SOLUTION) ×1 IMPLANT
PACK C SECTION WH (CUSTOM PROCEDURE TRAY) ×1 IMPLANT
PAD ABD 7.5X8 STRL (GAUZE/BANDAGES/DRESSINGS) IMPLANT
PAD OB MATERNITY 4.3X12.25 (PERSONAL CARE ITEMS) ×1 IMPLANT
STRIP CLOSURE SKIN 1/2X4 (GAUZE/BANDAGES/DRESSINGS) IMPLANT
SUT CHROMIC 0 CTX 36 (SUTURE) ×2 IMPLANT
SUT PLAIN 0 NONE (SUTURE) IMPLANT
SUT PLAIN 2 0 XLH (SUTURE) IMPLANT
SUT VIC AB 0 CT1 27 (SUTURE) ×4
SUT VIC AB 0 CT1 27XBRD ANBCTR (SUTURE) ×3 IMPLANT
SUT VIC AB 4-0 KS 27 (SUTURE) IMPLANT
SYR 20ML LL LF (SYRINGE) IMPLANT
SYR 50ML LL SCALE MARK (SYRINGE) IMPLANT
SYR CONTROL 10ML LL (SYRINGE) IMPLANT
TOWEL OR 17X24 6PK STRL BLUE (TOWEL DISPOSABLE) ×1 IMPLANT
TRAY FOLEY W/BAG SLVR 14FR LF (SET/KITS/TRAYS/PACK) ×1 IMPLANT
WATER STERILE IRR 1000ML POUR (IV SOLUTION) ×1 IMPLANT

## 2022-08-31 NOTE — Anesthesia Postprocedure Evaluation (Signed)
Anesthesia Post Note  Patient: Megan Henson  Procedure(s) Performed: REPEAT CESAREAN SECTION WITH BILATERAL TUBAL LIGATION     Patient location during evaluation: PACU Anesthesia Type: Spinal Level of consciousness: awake and alert Pain management: pain level controlled Vital Signs Assessment: post-procedure vital signs reviewed and stable Respiratory status: spontaneous breathing Cardiovascular status: stable Postop Assessment: spinal receding Anesthetic complications: no   No notable events documented.  Last Vitals:  Vitals:   08/31/22 1215 08/31/22 1330  BP: 110/62 116/75  Pulse: (!) 44 (!) 46  Resp: 18 16  Temp: 36.8 C 36.7 C  SpO2: 100% 99%    Last Pain:  Vitals:   08/31/22 1330  TempSrc: Oral  PainSc: [place]

## 2022-08-31 NOTE — Transfer of Care (Signed)
Immediate Anesthesia Transfer of Care Note  Patient: Megan Henson  Procedure(s) Performed: REPEAT CESAREAN SECTION WITH BILATERAL TUBAL LIGATION  Patient Location: PACU  Anesthesia Type:Spinal  Level of Consciousness: awake  Airway & Oxygen Therapy: Patient Spontanous Breathing  Post-op Assessment: Report given to RN and Post -op Vital signs reviewed and stable  Post vital signs: Reviewed and stable  Last Vitals:  Vitals Value Taken Time  BP 101/77 08/31/22 0845  Temp    Pulse 51 08/31/22 0847  Resp 20 08/31/22 0847  SpO2 99 % 08/31/22 0847  Vitals shown include unvalidated device data.  Last Pain:  Vitals:   08/31/22 0600  TempSrc: Oral         Complications: No notable events documented.

## 2022-08-31 NOTE — H&P (Addendum)
Megan Henson is a 36 y.o. G 3 P 1002 at 39 weeks here for repeat C Section. 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  OB History     Gravida  3   Para  2   Term  1   Preterm      AB      Living  2      SAB      IAB      Ectopic      Multiple      Live Births  2          Past Medical History:  Diagnosis Date   Prothrombin gene mutation [facility])    Past Surgical History:  Procedure Laterality Date   ARTHROSCOPIC REPAIR ACL Left    CESAREAN SECTION     FOOT SURGERY Right    TONSILLECTOMY     Family History: family history is not on file. Social History:  reports that she has never smoked. She has never used smokeless tobacco. She reports that she does not currently use alcohol. She reports that she does not use drugs.     Maternal Diabetes: No Genetic Screening: Normal Maternal Ultrasounds/Referrals: Normal Fetal Ultrasounds or other Referrals:  None Maternal Substance Abuse:  No Significant Maternal Medications:  None Significant Maternal Lab Results:  None Number of Prenatal Visits:greater than 3 verified prenatal visits Other Comments:  None  Review of Systems  All other systems reviewed and are negative.  History   Blood pressure 119/86, pulse (!) 54, temperature 98.7 F (37.1 C), temperature source Oral, resp. rate 18, height 5\' 5"  (1.651 m), weight 72.9 kg, SpO2 100 %. Maternal Exam:  Abdomen: Fetal presentation: vertex   Physical Exam Vitals and nursing note reviewed. Exam conducted with a chaperone present.  Constitutional:      Appearance: Normal appearance.  Cardiovascular:     Rate and Rhythm: Normal rate and regular rhythm.  Pulmonary:     Effort: Pulmonary effort is normal.  Abdominal:     General: Abdomen is flat.  Neurological:     Mental Status: She is alert.     Prenatal labs: ABO, Rh: --/--/O NEG Performed at [facility], [place] [address], [place], [place] [address]  [phone]) Antibody: POS (10/16  9924) Rubella: Immune (03/21 0000) RPR: NON REACTIVE (10/16 0914)  HBsAg: Negative (03/21 0000)  HIV: Non-reactive (03/21 0000)  GBS: Negative/-- (09/29 0000)   Assessment/Plan: IUP at term  Previous c section Desires permanent sterilization Repeat C Section / BTL  Consented  Patient has prothrombin gene mutation and will initiate Lovenox 24 hours post c section for 6 weeks  Cyril Mourning 08/31/2022, 7:23 AM

## 2022-08-31 NOTE — Op Note (Signed)
Megan Henson, Megan Henson MEDICAL RECORD NO: [id number] ACCOUNT NO: [id number] DATE OF BIRTH: 07-26-1986 FACILITY: [facility] LOCATION: [id number] PHYSICIAN: Verneal Wiers L. Helane Rima, MD  Operative Report   DATE OF PROCEDURE: 08/31/2022  PREOPERATIVE DIAGNOSES: 1.  Intrauterine pregnancy at 39 weeks. 2.  Previous cesarean section x2. 3.  Desires permanent sterilization. 4.  Prothrombin gene mutation.  POSTOPERATIVE DIAGNOSES: 1.  Intrauterine pregnancy at 39 weeks. 2.  Previous cesarean section x2. 3.  Desires permanent sterilization. 4.  Prothrombin gene mutation.  PROCEDURE:  Repeat low transverse cesarean section and bilateral tubal ligation with placement of Filshie clips.  SURGEON:  Kasem Mozer L. Annai Heick, MD  ANESTHESIA:  Spinal.  ESTIMATED BLOOD LOSS:  Less than 500 mL.  COMPLICATIONS:  None.  DRAINS:  Foley catheter.  PLACENTA: None.  DESCRIPTION OF PROCEDURE:  The patient was taken to the operating room and was then placed on the operating room table.  A spinal was placed.  She was prepped and draped and Foley catheter was inserted.  Timeout was performed.  A low transverse incision  was made, carried down to the fascia.  Fascia was scored in the midline and extended laterally.  The fascia was separated in the abdominal muscles.  The peritoneum was entered bluntly.  The peritoneal incision was then stretched.  The bladder blade was  inserted.  The bladder flap was created with sharp and blunt dissection.  A low transverse incision was made in the uterus.  Uterus was entered using a hemostat.  Amniotic fluid was clear.  The baby with cephalic presentation was delivered easily with  the vacuum extractor. Apgars 8 at 1 minute and 9 at 5 minutes.  Baby was a female infant.  The baby was handed to the NICU team after the cord was clamped and cut.  Placenta was manually removed, noted to be normal intact with a 3-vessel cord.  The  uterus was exteriorized and cleared of all clots and debris.  The  uterine incision was closed in one layer using 0 chromic in a running locked stitch.  The adnexa were normal.  Filshie clips were placed across the midportion of each fallopian tube.  The  uterus was returned to the abdomen.  Irrigation was performed.  Hemostasis was very good.  The peritoneum was closed using 0 Vicryl.  The fascia was closed using 0 Vicryl starting in each corner and meeting in the midline.  Exparel was then injected in  subfascial, suprafascial and the subcutaneous according to the manufacturer's specifications.  After the fascia was closed in a running stitch, the Exparel was injected.  The subcutaneous was closed with interrupted plain gut suture.  The skin was closed  with 3-0 Vicryl on a Keith needle.  Benzoin, Steri-Strips and honeycomb dressing were applied.  All sponge, lap and instrument counts were correct x2.  The patient went to recovery room in stable condition.   NIK D: 08/31/2022 8:42:43 am T: 08/31/2022 10:00:00 am  JOB: [id number]/ [id number]

## 2022-08-31 NOTE — Lactation Note (Signed)
This note was copied from a baby's chart. Lactation Consultation Note  Patient Name: Megan Henson [id number] Date: 08/31/2022 Reason for consult: Initial assessment;Term Age:36 hours   P3: Term infant at 39+0 weeks Feeding preference: Breast  Birth parent had baby latched when I arrived.  Reviewed breast feeding basics with parents.  When "[name]" became drowsy and was not effectively sucking I demonstrated gentle stimulation and breast compressions.  Taught improved positioning and body alignment.  Observed her feeding well; encouraged birth parent to continue to provide breast support during the entire feeding.  Encouraged to feed 8-12 times/24 hours or sooner if "[name]" shows cues.  Suggested she call her RN/LC for latch assistance as needed.  Support person at bedside and engaged with observing baby feed.   Maternal Data Has patient been taught Hand Expression?: No Does the patient have breastfeeding experience prior to this delivery?: Yes How long did the patient breastfeed?: Few months with her second child in addition to pumping  Feeding Mother's Current Feeding Choice: Breast Milk  LATCH Score Latch: Grasps breast easily, tongue down, lips flanged, rhythmical sucking.  Audible Swallowing: None  Type of Nipple: Everted at rest and after stimulation  Comfort (Breast/Nipple): Soft / non-tender  Hold (Positioning): Assistance needed to correctly position infant at breast and maintain latch.  LATCH Score: 7   Lactation Tools Discussed/Used    Interventions Interventions: Breast feeding basics reviewed;Assisted with latch;Skin to skin;Breast massage;Breast compression;Position options;Support pillows;Adjust position;Education;LC Services brochure  Discharge Pump: Personal (spectra)  Consult Status Consult Status: Follow-up Date: 09/01/22 Follow-up type: In-patient    Sevag Shearn R Axil Copeman 08/31/2022, 2:54 PM

## 2022-08-31 NOTE — Anesthesia Procedure Notes (Addendum)
Spinal  Patient location during procedure: OR Start time: 08/31/2022 7:34 AM End time: 08/31/2022 7:39 AM Reason for block: surgical anesthesia Staffing Performed: anesthesiologist  Anesthesiologist: Nolon Nations, MD Performed by: Nolon Nations, MD Authorized by: Nolon Nations, MD   Preanesthetic Checklist Completed: patient identified, IV checked, site marked, risks and benefits discussed, surgical consent, monitors and equipment checked, pre-op evaluation and timeout performed Spinal Block Patient position: sitting Prep: DuraPrep and site prepped and draped Patient monitoring: heart rate, continuous pulse ox and blood pressure Approach: midline Location: L3-4 Injection technique: single-shot Needle Needle type: Spinocan  Needle gauge: 25 G Needle length: 9 cm Additional Notes Expiration date of kit checked and confirmed. Patient tolerated procedure well, without complications.

## 2022-08-31 NOTE — Brief Op Note (Signed)
08/31/2022  8:33 AM  PATIENT:  Megan Henson  36 y.o. female  PRE-OPERATIVE DIAGNOSIS:   IUP at 39 weeks Previous C Section x 2  Desires Permanent sterilization Prothrombin gene mutation  POST-OPERATIVE DIAGNOSIS:   Same  PROCEDURE:  Procedure(s): REPEAT CESAREAN SECTION WITH BILATERAL TUBAL LIGATION (N/A)  SURGEON:  Surgeon(s) and Role:    * Dian Queen, MD - Primary  PHYSICIAN ASSISTANT:   ASSISTANTS: none   ANESTHESIA:   spinal  EBL:  per anesthesia record    BLOOD ADMINISTERED:none  DRAINS: Urinary Catheter (Foley)   LOCAL MEDICATIONS USED:  NONE  SPECIMEN:  No Specimen  DISPOSITION OF SPECIMEN:  N/A  COUNTS:  YES  TOURNIQUET:  * No tourniquets in log *  DICTATION: .Other Dictation: Dictation Number dictated  PLAN OF CARE: Admit to inpatient   PATIENT DISPOSITION:  PACU - hemodynamically stable.   Delay start of Pharmacological VTE agent (>24hrs) due to surgical blood loss or risk of bleeding: not applicable

## 2022-09-01 LAB — CREATININE, SERUM
Creatinine, Ser: 0.72 mg/dL (ref 0.44–1.00)
GFR, Estimated: >60 mL/min (ref >60)

## 2022-09-01 LAB — CBC
HCT: 27.3 % — ABNORMAL LOW (ref 36.0–46.0)
Hemoglobin: 9.3 g/dL — ABNORMAL LOW (ref 12.0–15.0)
MCH: 31 pg (ref 26.0–34.0)
MCHC: 34.1 g/dL (ref 30.0–36.0)
MCV: 91 fL (ref 80.0–100.0)
Platelets: 100 10*3/uL — ABNORMAL LOW (ref 150–400)
RBC: 3 MIL/uL — ABNORMAL LOW (ref 3.87–5.11)
RDW: 13.2 % (ref 11.5–15.5)
WBC: 9.3 10*3/uL (ref 4.0–10.5)
nRBC: 0 % (ref 0.0–0.2)

## 2022-09-01 LAB — BIRTH TISSUE RECOVERY COLLECTION (PLACENTA DONATION)

## 2022-09-01 MED ORDER — FERROUS SULFATE 325 (65 FE) MG PO TABS
325.0000 mg | ORAL_TABLET | Freq: Every day | ORAL | Status: DC
  Administered 2022-09-01 – 2022-09-02 (×2): 325 mg via ORAL
  Filled 2022-09-01 (×2): qty 1

## 2022-09-01 NOTE — Progress Notes (Addendum)
Postpartum Progress Note  S: No complaints. Feeling well. Lochia appropriate. No subjective fevers/chills, Cp, or SOB. Ambulating.   O:  Vitals:   09/01/22 0100 09/01/22 0605  BP: 94/71 99/61  Pulse: (!) 49 (!) 46  Resp: 17 17  Temp: 98 F (36.7 C) 98 F (36.7 C)  SpO2: 99% 99%    Gen: NAD, A&O Pulm: NWOB Abd: soft, appropriately ttp, fundus firm and below Umb. Incision c/d/I, pressure bandage removed - honeycomb saturated. Ext: No evidence of DVT, trace edema b/l  UOP adequate.   Labs Recent Results (from the past 2160 hour(s))  OB RESULT CONSOLE Group B Strep     Status: None   Collection Time: 08/13/22 12:00 AM  Result Value Ref Range   GBS Negative   RPR     Status: None   Collection Time: 08/30/22  9:14 AM  Result Value Ref Range   RPR Ser Ql NON REACTIVE NON REACTIVE    Comment: Performed at [facility], [address], [place], [place] [address]  CBC     Status: Abnormal   Collection Time: 08/30/22  9:14 AM  Result Value Ref Range   WBC 7.0 4.0 - 10.5 K/uL   RBC 3.91 3.87 - 5.11 MIL/uL   Hemoglobin 12.2 12.0 - 15.0 g/dL   HCT 31.5 (L) 17.6 - 16.0 %   MCV 90.3 80.0 - 100.0 fL   MCH 31.2 26.0 - 34.0 pg   MCHC 34.6 30.0 - 36.0 g/dL   RDW 73.7 10.6 - 26.9 %   Platelets 119 (L) 150 - 400 K/uL   nRBC 0.0 0.0 - 0.2 %    Comment: Performed at [facility], [address], [place], [place] [address]  Type and screen [facility]     Status: None   Collection Time: 08/30/22  9:18 AM  Result Value Ref Range   ABO/RH(D) O NEG    Antibody Screen POS    Sample Expiration 09/02/2022,2359    Antibody Identification      PASSIVELY ACQUIRED ANTI-D Performed at [facility], [address], [place], [place] [address]   ABO/Rh     Status: None   Collection Time: 08/31/22  5:00 AM  Result Value Ref Range   ABO/RH(D)      O NEG Performed at [facility], [address] [place], [place] [address]   CBC     Status: Abnormal    Collection Time: 09/01/22  5:05 AM  Result Value Ref Range   WBC 9.3 4.0 - 10.5 K/uL   RBC 3.00 (L) 3.87 - 5.11 MIL/uL   Hemoglobin 9.3 (L) 12.0 - 15.0 g/dL   HCT 18.2 (L) 99.3 - 71.6 %   MCV 91.0 80.0 - 100.0 fL   MCH 31.0 26.0 - 34.0 pg   MCHC 34.1 30.0 - 36.0 g/dL   RDW 96.7 89.3 - 81.0 %   Platelets 100 (L) 150 - 400 K/uL   nRBC 0.0 0.0 - 0.2 %    Comment: Performed at [facility], [address] [place], [place] [address]  Creatinine, serum     Status: None   Collection Time: 09/01/22  5:05 AM  Result Value Ref Range   Creatinine, Ser 0.72 0.44 - 1.00 mg/dL   GFR, Estimated >25 >85 mL/min    Comment: (NOTE) Calculated using the CKD-EPI Creatinine Equation (2021) Performed at [facility], [address] [place],  [place] [address]   Collect bld for placenta donatation     Status: None   Collection Time: 09/01/22  5:05 AM  Result Value Ref Range   Placenta donation bld collect Collected by Laboratory     Comment: Performed at [facility], [place] [address], [place], [place] [address]     A/P:  POD1 s/p rCS+BTL, doing well pp. AFVSS. Benign exam. Hx HELLP last pregnancy - TCP on recent CBC (plts 100), will closely follow. Repeat CBC in AM. Bps have been wnl. Anemia - postoperative, likely due to acute blood loss. Started PO iron. Change honeycomb dressing today. Continue present care.  Plan for d/c POD#2 or 3.   Hurshel Party, MD

## 2022-09-02 ENCOUNTER — Encounter (HOSPITAL_COMMUNITY): Payer: Self-pay | Admitting: Obstetrics and Gynecology

## 2022-09-02 ENCOUNTER — Observation Stay (HOSPITAL_COMMUNITY)
Admission: AD | Admit: 2022-09-02 | Discharge: 2022-09-04 | Disposition: A | Discharge status: Home / Self Care | Payer: BC Managed Care – PPO | Attending: Obstetrics and Gynecology | Admitting: Obstetrics and Gynecology

## 2022-09-02 DIAGNOSIS — O165 Unspecified maternal hypertension, complicating the puerperium: Secondary | ICD-10-CM | POA: Diagnosis not present

## 2022-09-02 DIAGNOSIS — O1490 Unspecified pre-eclampsia, unspecified trimester: Secondary | ICD-10-CM | POA: Diagnosis present

## 2022-09-02 DIAGNOSIS — O1415 Severe pre-eclampsia, complicating the puerperium: Secondary | ICD-10-CM | POA: Diagnosis not present

## 2022-09-02 DIAGNOSIS — Z79899 Other long term (current) drug therapy: Secondary | ICD-10-CM | POA: Insufficient documentation

## 2022-09-02 DIAGNOSIS — Z98891 History of uterine scar from previous surgery: Secondary | ICD-10-CM

## 2022-09-02 DIAGNOSIS — O1495 Unspecified pre-eclampsia, complicating the puerperium: Principal | ICD-10-CM | POA: Insufficient documentation

## 2022-09-02 LAB — COMPREHENSIVE METABOLIC PANEL
ALT: 15 U/L (ref 0–44)
AST: 25 U/L (ref 15–41)
Albumin: 2.6 g/dL — ABNORMAL LOW (ref 3.5–5.0)
Alkaline Phosphatase: 160 U/L — ABNORMAL HIGH (ref 38–126)
Anion gap: 5 (ref 5–15)
BUN: 12 mg/dL (ref 6–20)
CO2: 27 mmol/L (ref 22–32)
Calcium: 8.9 mg/dL (ref 8.9–10.3)
Chloride: 107 mmol/L (ref 98–111)
Creatinine, Ser: 0.93 mg/dL (ref 0.44–1.00)
GFR, Estimated: >60 mL/min (ref >60)
Glucose, Bld: 105 mg/dL — ABNORMAL HIGH (ref 70–99)
Potassium: 4 mmol/L (ref 3.5–5.1)
Sodium: 139 mmol/L (ref 135–145)
Total Bilirubin: 0.6 mg/dL (ref 0.3–1.2)
Total Protein: 5.7 g/dL — ABNORMAL LOW (ref 6.5–8.1)

## 2022-09-02 LAB — RH IG WORKUP (INCLUDES ABO/RH)
Fetal Screen: NEGATIVE
Gestational Age(Wks): 39
Unit division: 0

## 2022-09-02 LAB — CBC
HCT: 32.6 % — ABNORMAL LOW (ref 36.0–46.0)
HCT: 31.7 % — ABNORMAL LOW (ref 36.0–46.0)
Hemoglobin: 10.6 g/dL — ABNORMAL LOW (ref 12.0–15.0)
Hemoglobin: 10.9 g/dL — ABNORMAL LOW (ref 12.0–15.0)
MCH: 30.3 pg (ref 26.0–34.0)
MCH: 31.7 pg (ref 26.0–34.0)
MCHC: 32.5 g/dL (ref 30.0–36.0)
MCHC: 34.4 g/dL (ref 30.0–36.0)
MCV: 93.1 fL (ref 80.0–100.0)
MCV: 92.2 fL (ref 80.0–100.0)
Platelets: 127 10*3/uL — ABNORMAL LOW (ref 150–400)
Platelets: 155 10*3/uL (ref 150–400)
RBC: 3.5 MIL/uL — ABNORMAL LOW (ref 3.87–5.11)
RBC: 3.44 MIL/uL — ABNORMAL LOW (ref 3.87–5.11)
RDW: 13.7 % (ref 11.5–15.5)
RDW: 13.5 % (ref 11.5–15.5)
WBC: 8.5 10*3/uL (ref 4.0–10.5)
WBC: 8.3 10*3/uL (ref 4.0–10.5)
nRBC: 0 % (ref 0.0–0.2)
nRBC: 0 % (ref 0.0–0.2)

## 2022-09-02 LAB — PROTEIN / CREATININE RATIO, URINE
Creatinine, Urine: 46 mg/dL
Protein Creatinine Ratio: 0.57 mg/mg{Cre} — ABNORMAL HIGH (ref 0.00–0.15)
Total Protein, Urine: 26 mg/dL

## 2022-09-02 MED ORDER — MAGNESIUM SULFATE BOLUS VIA INFUSION
4.0000 g | Freq: Once | INTRAVENOUS | Status: AC
  Administered 2022-09-03: 4 g via INTRAVENOUS
  Filled 2022-09-02: qty 1000

## 2022-09-02 MED ORDER — DOCUSATE SODIUM 100 MG PO CAPS: 100.0000 mg | ORAL_CAPSULE | Freq: Two times a day (BID) | ORAL | 2 refills | Status: DC

## 2022-09-02 MED ORDER — LABETALOL HCL 5 MG/ML IV SOLN: 40.0000 mg | INTRAVENOUS | Status: DC | PRN

## 2022-09-02 MED ORDER — ACETAMINOPHEN-CAFFEINE 500-65 MG PO TABS
2.0000 | ORAL_TABLET | Freq: Once | ORAL | Status: AC
  Administered 2022-09-02: 2 via ORAL
  Filled 2022-09-02: qty 2

## 2022-09-02 MED ORDER — MAGNESIUM SULFATE 40 GM/1000ML IV SOLN
1.0000 g/h | INTRAVENOUS | Status: AC
  Administered 2022-09-03: 2 g/h via INTRAVENOUS
  Administered 2022-09-03: 1 g/h via INTRAVENOUS
  Filled 2022-09-02 (×2): qty 1000

## 2022-09-02 MED ORDER — OXYCODONE HCL 5 MG PO TABS: 5.0000 mg | ORAL_TABLET | ORAL | 0 refills | Status: DC | PRN

## 2022-09-02 MED ORDER — NIFEDIPINE ER OSMOTIC RELEASE 30 MG PO TB24
30.0000 mg | ORAL_TABLET | Freq: Once | ORAL | Status: AC
  Administered 2022-09-02: 30 mg via ORAL
  Filled 2022-09-02: qty 1

## 2022-09-02 MED ORDER — LABETALOL HCL 5 MG/ML IV SOLN: 20.0000 mg | INTRAVENOUS | Status: DC | PRN

## 2022-09-02 MED ORDER — NIFEDIPINE ER OSMOTIC RELEASE 30 MG PO TB24
30.0000 mg | ORAL_TABLET | Freq: Every day | ORAL | Status: DC
  Administered 2022-09-03 – 2022-09-04 (×2): 30 mg via ORAL
  Filled 2022-09-02 (×2): qty 1

## 2022-09-02 MED ORDER — ENOXAPARIN SODIUM 40 MG/0.4ML IJ SOSY
40.0000 mg | PREFILLED_SYRINGE | INTRAMUSCULAR | Status: DC
  Administered 2022-09-03 – 2022-09-04 (×2): 40 mg via SUBCUTANEOUS
  Filled 2022-09-02 (×2): qty 0.4

## 2022-09-02 MED ORDER — LACTATED RINGERS IV SOLN: INTRAVENOUS | Status: DC

## 2022-09-02 MED ORDER — ENOXAPARIN SODIUM 40 MG/0.4ML IJ SOSY: 40.0000 mg | PREFILLED_SYRINGE | INTRAMUSCULAR | 1 refills | Status: DC

## 2022-09-02 MED ORDER — FERROUS SULFATE 325 (65 FE) MG PO TBEC: 325.0000 mg | DELAYED_RELEASE_TABLET | Freq: Two times a day (BID) | ORAL | 2 refills | Status: DC

## 2022-09-02 MED ORDER — HYDRALAZINE HCL 20 MG/ML IJ SOLN: 10.0000 mg | INTRAMUSCULAR | Status: DC | PRN

## 2022-09-02 MED ORDER — LABETALOL HCL 5 MG/ML IV SOLN: 80.0000 mg | INTRAVENOUS | Status: DC | PRN

## 2022-09-02 MED ORDER — IBUPROFEN 600 MG PO TABS: 600.0000 mg | ORAL_TABLET | Freq: Four times a day (QID) | ORAL | 0 refills | Status: DC | PRN

## 2022-09-02 MED ORDER — IBUPROFEN 600 MG PO TABS
600.0000 mg | ORAL_TABLET | Freq: Once | ORAL | Status: AC
  Administered 2022-09-02: 600 mg via ORAL
  Filled 2022-09-02: qty 1

## 2022-09-02 NOTE — MAU Note (Signed)
.  Megan Henson is a 36 y.o. postpartum here in MAU reporting some blurry vision today and HTN. Pt had repeat C/S on 10/17 and went home today at 1300. No hx HTN with this pregnancy. Did have HELLP with first pregnancy but states her b/p was never very high with that-just abnormal labs. 134/86 130/84 at home. B/Ps were low before d/c. Does have a mild headache.   Onset of complaint: this afternoon Pain score: 5 for incision pain and 4 for headache Vitals:   09/02/22 2139  Pulse: (!) 58  Resp: 17  Temp: 98.8 F (37.1 C)  SpO2: 100%     FHT:na Lab orders placed from triage:  none

## 2022-09-02 NOTE — MAU Provider Note (Signed)
Chief Complaint:  Hypertension   Event Date/Time   First Provider Initiated Contact with Patient 09/02/22 2156      HPI: Megan Henson is a 36 y.o. G3P2003 POD#2 (repeat C/S)  who presents to maternity admissions reporting elevated blood pressure at home with headache and blurred vision.  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  She did not have much elevation in BPs with that pregnancy despite having HELLP.   Took Tylenol and Ibuprofen without relief early this afternoon. Has had some mild thrombocytopenia for several weeks. Is on postpartum Lovenox for Prothrombin Gene Mutation. She reports minimal vaginal bleeding, but no urinary symptoms,  dizziness, n/v, or fever/chills.    Hypertension This is a new problem. The current episode started today. Associated symptoms include blurred vision and headaches. Pertinent negatives include no anxiety, chest pain, peripheral edema or shortness of breath. There are no associated agents to hypertension. Past treatments include nothing. There are no compliance problems.    RN noteL Megan Henson is a 36 y.o. postpartum here in MAU reporting some blurry vision today and HTN. Pt had repeat C/S on 10/17 and went home today at 1300. No hx HTN with this pregnancy. Did have HELLP with first pregnancy but states her b/p was never very high with that-just abnormal labs. 134/86 130/84 at home. B/Ps were low before d/c. Does have a mild headache.    Onset of complaint: this afternoon  Past Medical History: Past Medical History:  Diagnosis Date   Prothrombin gene mutation (HCC)     Past obstetric history: OB History  Gravida Para Term Preterm AB Living  3 3 2     3   SAB IAB Ectopic Multiple Live Births        0 3    # Outcome Date GA Lbr Len/2nd Weight Sex Delivery Anes PTL Lv  3 Term 08/31/22 [redacted]w[redacted]d  2880 g F CS-Vac Spinal  LIV  2 Term      CS-LTranv   LIV  1 Para      CS-LTranv   LIV     Birth Comments: HELLP    Past Surgical  History: Past Surgical History:  Procedure Laterality Date   ARTHROSCOPIC REPAIR ACL Left    CESAREAN SECTION     CESAREAN SECTION WITH BILATERAL TUBAL LIGATION N/A 08/31/2022   Procedure: REPEAT CESAREAN SECTION WITH BILATERAL TUBAL LIGATION;  Surgeon: 09/02/2022, MD;  Location: [facility];  Service: Obstetrics;  Laterality: N/A;   FOOT SURGERY Right    TONSILLECTOMY      Family History: No family history on file.  Social History: Social History   Tobacco Use   Smoking status: Never   Smokeless tobacco: Never  Vaping Use   Vaping Use: Never used  Substance Use Topics   Alcohol use: Not Currently   Drug use: Never    Allergies:  Allergies  Allergen Reactions   Sulfa Antibiotics Rash    Meds:  Medications Prior to Admission  Medication Sig Dispense Refill Last Dose   calcium carbonate (TUMS - DOSED IN MG ELEMENTAL CALCIUM) 500 MG chewable tablet Chew 2 tablets by mouth 2 (two) times daily as needed for indigestion or heartburn.      docusate sodium (COLACE) 100 MG capsule Take 1 capsule (100 mg total) by mouth 2 (two) times daily. 60 capsule 2    [START ON 09/03/2022] enoxaparin (LOVENOX) 40 MG/0.4ML injection Inject 0.4 mLs (40 mg total) into the skin daily. 12  mL 1    ferrous sulfate 325 (65 FE) MG EC tablet Take 1 tablet (325 mg total) by mouth 2 (two) times daily. 60 tablet 2    ibuprofen (ADVIL) 600 MG tablet Take 1 tablet (600 mg total) by mouth every 6 (six) hours as needed. 30 tablet 0    oxyCODONE (OXY IR/ROXICODONE) 5 MG immediate release tablet Take 1 tablet (5 mg total) by mouth every 4 (four) hours as needed for severe pain. 15 tablet 0    Prenatal Vit-Fe Fumarate-FA (MULTIVITAMIN-PRENATAL) 27-0.8 MG TABS tablet Take 1 tablet by mouth in the morning.       I have reviewed patient's Past Medical Hx, Surgical Hx, Family Hx, Social Hx, medications and allergies.  ROS:  Review of Systems  Eyes:  Positive for blurred vision.  Respiratory:  Negative for  shortness of breath.   Cardiovascular:  Negative for chest pain.  Neurological:  Positive for headaches.   Other systems negative     Physical Exam   Vitals:   09/02/22 2138 09/02/22 2139 09/02/22 2148 09/02/22 2157  BP: (!) 150/72   (!) 144/76  Pulse:  (!) 58  62  Resp:  17    Temp:  98.8 F (37.1 C)    SpO2:  100%    Weight:   71.2 kg   Height:  5\' 5"  (1.651 m)     Vitals:   09/02/22 2230 09/02/22 2245 09/02/22 2300 09/02/22 2315  BP: 123/68 124/72 133/80 129/83  Pulse: 60 (!) 59 (!) 58 (!) 58  Resp:      Temp:      SpO2: 98% 100% 100% 100%  Weight:      Height:         Constitutional: Well-developed, well-nourished female in no acute distress.  Cardiovascular: normal rate and rhythm Respiratory: normal effort, no distress.  GI: Abd soft, non-tender except slight tenderness over RUQ.   Incision healing.  Nondistended.  No rebound, No guarding.   MS: Extremities nontender, no edema, normal ROM Neurologic: Alert and oriented x 4.   Grossly nonfocal. GU: Neg CVAT. Skin:  Warm and Dry Psych:  Affect appropriate.    Labs: Results for orders placed or performed during the hospital encounter of 09/02/22 (from the past 24 hour(s))  CBC     Status: Abnormal   Collection Time: 09/02/22  9:22 PM  Result Value Ref Range   WBC 8.3 4.0 - 10.5 K/uL   RBC 3.44 (L) 3.87 - 5.11 MIL/uL   Hemoglobin 10.9 (L) 12.0 - 15.0 g/dL   HCT 09/04/22 (L) 16.1 - 09.6 %   MCV 92.2 80.0 - 100.0 fL   MCH 31.7 26.0 - 34.0 pg   MCHC 34.4 30.0 - 36.0 g/dL   RDW 04.5 40.9 - 81.1 %   Platelets 155 150 - 400 K/uL   nRBC 0.0 0.0 - 0.2 %  Comprehensive metabolic panel     Status: Abnormal   Collection Time: 09/02/22  9:22 PM  Result Value Ref Range   Sodium 139 135 - 145 mmol/L   Potassium 4.0 3.5 - 5.1 mmol/L   Chloride 107 98 - 111 mmol/L   CO2 27 22 - 32 mmol/L   Glucose, Bld 105 (H) 70 - 99 mg/dL   BUN 12 6 - 20 mg/dL   Creatinine, Ser 09/04/22 0.44 - 1.00 mg/dL   Calcium 8.9 8.9 - 7.82 mg/dL    Total Protein 5.7 (L) 6.5 - 8.1 g/dL   Albumin 2.6 (  L) 3.5 - 5.0 g/dL   AST 25 15 - 41 U/L   ALT 15 0 - 44 U/L   Alkaline Phosphatase 160 (H) 38 - 126 U/L   Total Bilirubin 0.6 0.3 - 1.2 mg/dL   GFR, Estimated >60 >60 mL/min   Anion gap 5 5 - 15  Protein / creatinine ratio, urine     Status: Abnormal   Collection Time: 09/02/22  9:56 PM  Result Value Ref Range   Creatinine, Urine 46 mg/dL   Total Protein, Urine 26 mg/dL   Protein Creatinine Ratio 0.57 (H) 0.00 - 0.15 mg/mg[Cre]    --/--/O NEG Performed at [facility] [address] [place], [place] [address]  (10/17 0500)  Imaging:  No results found.  MAU Course/MDM: I have reviewed the triage vital signs and the nursing notes.   Pertinent labs & imaging results that were available during my care of the patient were reviewed by me and considered in my medical decision making (see chart for details).      I have reviewed her medical records including past results, notes and treatments.   I have ordered labs as follows:  CBC, CMET, PrCrRatio (discussed may not be accurate due to vaginal bleeding which is minimal per pt) Imaging ordered: none Results reviewed.   Consult Dr Harolyn Rutherford who recommends admission and Magnesium Sulfate infusion.  Dr Royston Sinner agrees.   Treatments in MAU included Nifedipine XL 30mg , Excedrin Tension, and Ibuprofen   Headache went from a "5" to a "4".  Incisional pain is better. .   Pt stable at time of transfer  Assessment: Postop Day #2 Postpartum Preeclampsia with severe features History of HELLP syndrome  Plan: Admit to OBSCU Routine orders Magnesium Sulfate infusion MD to follow   Hansel Feinstein CNM, MSN Certified Nurse-Midwife 09/02/2022 9:56 PM

## 2022-09-02 NOTE — H&P (Signed)
Megan Henson is an 36 y.o. female. Presenting for elevated BP. See MAU HPI for detailed HPI. She had no severe range Bps but did have a HA. Bps normalized with one dose Procardia 30mg . HA improved and now mild.  No LMP recorded.    Past Medical History:  Diagnosis Date   Prothrombin gene mutation [facility])     Past Surgical History:  Procedure Laterality Date   ARTHROSCOPIC REPAIR ACL Left    CESAREAN SECTION     CESAREAN SECTION WITH BILATERAL TUBAL LIGATION N/A 08/31/2022   Procedure: REPEAT CESAREAN SECTION WITH BILATERAL TUBAL LIGATION;  Surgeon: Dian Queen, MD;  Location: [place] LD ORS;  Service: Obstetrics;  Laterality: N/A;   FOOT SURGERY Right    TONSILLECTOMY      History reviewed. No pertinent family history.  Social History:  reports that she has never smoked. She has never used smokeless tobacco. She reports that she does not currently use alcohol. She reports that she does not use drugs.  Allergies:  Allergies  Allergen Reactions   Sulfa Antibiotics Rash    Medications Prior to Admission  Medication Sig Dispense Refill Last Dose   [START ON 09/03/2022] enoxaparin (LOVENOX) 40 MG/0.4ML injection Inject 0.4 mLs (40 mg total) into the skin daily. 12 mL 1 09/02/2022   ibuprofen (ADVIL) 600 MG tablet Take 1 tablet (600 mg total) by mouth every 6 (six) hours as needed. 30 tablet 0 09/02/2022   calcium carbonate (TUMS - DOSED IN MG ELEMENTAL CALCIUM) 500 MG chewable tablet Chew 2 tablets by mouth 2 (two) times daily as needed for indigestion or heartburn.      docusate sodium (COLACE) 100 MG capsule Take 1 capsule (100 mg total) by mouth 2 (two) times daily. 60 capsule 2    ferrous sulfate 325 (65 FE) MG EC tablet Take 1 tablet (325 mg total) by mouth 2 (two) times daily. 60 tablet 2    oxyCODONE (OXY IR/ROXICODONE) 5 MG immediate release tablet Take 1 tablet (5 mg total) by mouth every 4 (four) hours as needed for severe pain. 15 tablet 0    Prenatal Vit-Fe Fumarate-FA  (MULTIVITAMIN-PRENATAL) 27-0.8 MG TABS tablet Take 1 tablet by mouth in the morning.       Review of Systems  Blood pressure 129/83, pulse (!) 58, temperature 98.8 F (37.1 C), resp. rate 17, height 5\' 5"  (1.651 m), weight 71.2 kg, SpO2 100 %, currently breastfeeding. Physical Exam Constitutional: Well-developed, well-nourished female in no acute distress.  Cardiovascular: normal rate and rhythm Respiratory: normal effort, no distress.  GI: Abd soft, non-tender except slight tenderness over RUQ.   Incision healing.  Nondistended.  No rebound, No guarding.   MS: Extremities nontender, no edema, normal ROM Neurologic: Alert and oriented x 4.   Grossly nonfocal. GU: Neg CVAT. Skin:  Warm and Dry Psych:  Affect appropriate.    Results for orders placed or performed during the hospital encounter of 09/02/22 (from the past 24 hour(s))  CBC     Status: Abnormal   Collection Time: 09/02/22  9:22 PM  Result Value Ref Range   WBC 8.3 4.0 - 10.5 K/uL   RBC 3.44 (L) 3.87 - 5.11 MIL/uL   Hemoglobin 10.9 (L) 12.0 - 15.0 g/dL   HCT 31.7 (L) 36.0 - 46.0 %   MCV 92.2 80.0 - 100.0 fL   MCH 31.7 26.0 - 34.0 pg   MCHC 34.4 30.0 - 36.0 g/dL   RDW 13.5 11.5 - 15.5 %  Platelets 155 150 - 400 K/uL   nRBC 0.0 0.0 - 0.2 %  Comprehensive metabolic panel     Status: Abnormal   Collection Time: 09/02/22  9:22 PM  Result Value Ref Range   Sodium 139 135 - 145 mmol/L   Potassium 4.0 3.5 - 5.1 mmol/L   Chloride 107 98 - 111 mmol/L   CO2 27 22 - 32 mmol/L   Glucose, Bld 105 (H) 70 - 99 mg/dL   BUN 12 6 - 20 mg/dL   Creatinine, Ser 7.12 0.44 - 1.00 mg/dL   Calcium 8.9 8.9 - 45.8 mg/dL   Total Protein 5.7 (L) 6.5 - 8.1 g/dL   Albumin 2.6 (L) 3.5 - 5.0 g/dL   AST 25 15 - 41 U/L   ALT 15 0 - 44 U/L   Alkaline Phosphatase 160 (H) 38 - 126 U/L   Total Bilirubin 0.6 0.3 - 1.2 mg/dL   GFR, Estimated >09 >98 mL/min   Anion gap 5 5 - 15  Protein / creatinine ratio, urine     Status: Abnormal   Collection  Time: 09/02/22  9:56 PM  Result Value Ref Range   Creatinine, Urine 46 mg/dL   Total Protein, Urine 26 mg/dL   Protein Creatinine Ratio 0.57 (H) 0.00 - 0.15 mg/mg[Cre]    No results found.  Assessment/Plan: 36 yo G3P3 POD#2 s/p RCS with BTL and hx HELLP syndrome in PRIOR pregnancy admitted with gestational HTN.  Magnesium  Procardia 30mg  Continue lovenox for PT gene mutation Daily labs Routine pp care otherwise  09/02/2022, 11:39 PM

## 2022-09-02 NOTE — Discharge Summary (Signed)
Postpartum Discharge Summary     Patient Name: Megan Henson DOB: 06/13/1986 MRN: [id number]  Date of admission: 08/31/2022 Delivery date:08/31/2022  Delivering provider: Dian Queen  Date of discharge: 09/02/2022  Admitting diagnosis: S/P cesarean section [Z98.891] Pregnancy [Z34.90] Intrauterine pregnancy: [redacted]w[redacted]d    Secondary diagnosis:  Principal Problem:   S/P cesarean section Active Problems:   Pregnancy  Additional problems: History of HELLP syndrome in prior pregnancy. Hx Prothrombin Gene mutation    Discharge diagnosis: Term Pregnancy Delivered                                              Post partum procedures:rhogam Augmentation: N/A Complications: None  Hospital course: Sceduled C/S   36y.o. yo G3P2003 at 31w0das admitted to the hospital 08/31/2022 for scheduled cesarean section with the following indication:Elective Repeat.Delivery details are as follows:  Membrane Rupture Time/Date: 8:05 AM ,08/31/2022   Delivery Method:C-Section, Vacuum Assisted  Details of operation can be found in separate operative note.  Patient had a postpartum course complicated by hx prothrombin gene mutation so lovenox was started.  She is ambulating, tolerating a regular diet, passing flatus, and urinating well. Patient is discharged home in stable condition on  09/02/22        Newborn Data: Birth date:08/31/2022  Birth time:8:06 AM  Gender:Female  Living status:Living  Apgars:9 ,9  Weight:2880 g     Magnesium Sulfate received: No BMZ received: No Rhophylac:Yes MMR:N/A T-DaP:Given prenatally Flu: N/A Transfusion:No  Physical exam  Vitals:   09/01/22 0605 09/01/22 1434 09/01/22 1954 09/02/22 0529  BP: 99/61 105/66 115/73 119/74  Pulse: (!) 46 (!) 55 (!) 52 (!) 55  Resp: 17 18 17 16   Temp: 98 F (36.7 C) 98.3 F (36.8 C) (!) 97.5 F (36.4 C) 98.6 F (37 C)  TempSrc: Oral Tympanic Axillary Axillary  SpO2: 99%     Weight:      Height:       General:  alert Lochia: appropriate Uterine Fundus: firm Incision: Healing well with no significant drainage DVT Evaluation: No evidence of DVT seen on physical exam. Labs: Lab Results  Component Value Date   WBC 8.5 09/02/2022   HGB 10.6 (L) 09/02/2022   HCT 32.6 (L) 09/02/2022   MCV 93.1 09/02/2022   PLT 127 (L) 09/02/2022      Latest Ref Rng & Units 09/01/2022    5:05 AM  CMP  Creatinine 0.44 - 1.00 mg/dL 0.72    Edinburgh Score:    09/01/2022   11:45 AM  Edinburgh Postnatal Depression Scale Screening Tool  I have been able to laugh and see the funny side of things. 0  I have looked forward with enjoyment to things. 0  I have blamed myself unnecessarily when things went wrong. 1  I have been anxious or worried for no good reason. 1  I have felt scared or panicky for no good reason. 0  Things have been getting on top of me. 0  I have been so unhappy that I have had difficulty sleeping. 0  I have felt sad or miserable. 0  I have been so unhappy that I have been crying. 0  The thought of harming myself has occurred to me. 0  Edinburgh Postnatal Depression Scale Total 2      After visit meds:  Allergies as of 09/02/2022  Reactions   Sulfa Antibiotics Rash        Medication List     TAKE these medications    calcium carbonate 500 MG chewable tablet Commonly known as: TUMS - dosed in mg elemental calcium Chew 2 tablets by mouth 2 (two) times daily as needed for indigestion or heartburn.   docusate sodium 100 MG capsule Commonly known as: Colace Take 1 capsule (100 mg total) by mouth 2 (two) times daily.   enoxaparin 40 MG/0.4ML injection Commonly known as: LOVENOX Inject 0.4 mLs (40 mg total) into the skin daily. Start taking on: September 03, 2022   ferrous sulfate 325 (65 FE) MG EC tablet Take 1 tablet (325 mg total) by mouth 2 (two) times daily.   ibuprofen 600 MG tablet Commonly known as: ADVIL Take 1 tablet (600 mg total) by mouth every 6 (six) hours  as needed.   multivitamin-prenatal 27-0.8 MG Tabs tablet Take 1 tablet by mouth in the morning.   oxyCODONE 5 MG immediate release tablet Commonly known as: Oxy IR/ROXICODONE Take 1 tablet (5 mg total) by mouth every 4 (four) hours as needed for severe pain.         Discharge home in stable condition Infant Feeding: Bottle and Breast Infant Disposition:home with mother Discharge instruction: per After Visit Summary and Postpartum booklet. Activity: Advance as tolerated. Pelvic rest for 6 weeks.  Diet: routine diet Anticipated Birth Control:  BTL done at CS Postpartum Appointment:6 weeks Additional Postpartum F/U:  None Future Appointments:No future appointments. Follow up Visit:      09/02/2022 Tyson Dense, MD

## 2022-09-02 NOTE — Lactation Note (Signed)
This note was copied from a baby's chart. Lactation Consultation Note  Patient Name: Megan Henson UXNAT'F Date: 09/02/2022 Reason for consult: Follow-up assessment;Term;Infant weight loss Age:35 hours  LC in to visit with P3 Mom of term baby delivered by C/S.  Baby is at a 9% weight loss and Mom started supplementing baby with formula by bottle over night as she could tell baby wasn't feeding well and acting more hungry after breastfeeding.  Mom has a history of low milk supply with 2 previous babies.  Mom also requested a DEBP and she has pumped 3 times so far.  Mom expressed 4 ml last pumping.  Spoon provided for spoon feeding EBM before offering the formula.  Offered to assist/assess a feeding prior to discharge prn.  Baby just took 35 ml formula by bottle, so she is sleeping.   Encouraged STS with baby as much as possible, offering the breast with feeding cues, or at least every 3 hrs (due to weight loss).  Mom will supplement with EBM+/formula by paced bottle and pump 15-20 mins.  Talked about OP lactation F/U.  Mom aware of [facility].  Ped group has an IBCLC.  Ped appt 10/20 and she will inquire, otherwise she can call our OP number and request one.  Engorgement prevention and treatment reviewed.    Lactation Tools Discussed/Used Tools: Pump;Flanges;Bottle Flange Size: 27 (LC suggested she try a 24 mm) Breast pump type: Double-Electric Breast Pump;Manual Pump Education: Setup, frequency, and cleaning;Milk Storage Reason for Pumping: Support milk supply/weight loss/history of low milk supply with 2 other babies Pumping frequency: Mom started pumping over night.  Mom encouraged to pump after breastfeeding Pumped volume: 4 mL  Interventions Interventions: Breast feeding basics reviewed;Skin to skin;Breast massage;Hand express;DEBP;Pace feeding  Discharge Discharge Education: Engorgement and breast care;Warning signs for feeding baby;Outpatient  recommendation Pump: Personal;DEBP (Spectra)  Consult Status Consult Status: Complete Date: 09/02/22 Follow-up type: Call as needed    Broadus John 09/02/2022, 10:41 AM

## 2022-09-03 ENCOUNTER — Observation Stay (HOSPITAL_COMMUNITY): Payer: BC Managed Care – PPO

## 2022-09-03 ENCOUNTER — Other Ambulatory Visit: Payer: Self-pay

## 2022-09-03 DIAGNOSIS — R519 Headache, unspecified: Secondary | ICD-10-CM | POA: Diagnosis not present

## 2022-09-03 DIAGNOSIS — O1490 Unspecified pre-eclampsia, unspecified trimester: Secondary | ICD-10-CM | POA: Diagnosis not present

## 2022-09-03 LAB — MAGNESIUM: Magnesium: 6.6 mg/dL (ref 1.7–2.4)

## 2022-09-03 LAB — CBC
HCT: 31.6 % — ABNORMAL LOW (ref 36.0–46.0)
Hemoglobin: 10.8 g/dL — ABNORMAL LOW (ref 12.0–15.0)
MCH: 31.1 pg (ref 26.0–34.0)
MCHC: 34.2 g/dL (ref 30.0–36.0)
MCV: 91.1 fL (ref 80.0–100.0)
Platelets: 152 10*3/uL (ref 150–400)
RBC: 3.47 MIL/uL — ABNORMAL LOW (ref 3.87–5.11)
RDW: 13.4 % (ref 11.5–15.5)
WBC: 7.7 10*3/uL (ref 4.0–10.5)
nRBC: 0 % (ref 0.0–0.2)

## 2022-09-03 LAB — COMPREHENSIVE METABOLIC PANEL
ALT: 16 U/L (ref 0–44)
AST: 25 U/L (ref 15–41)
Albumin: 2.6 g/dL — ABNORMAL LOW (ref 3.5–5.0)
Alkaline Phosphatase: 152 U/L — ABNORMAL HIGH (ref 38–126)
Anion gap: 10 (ref 5–15)
BUN: 10 mg/dL (ref 6–20)
CO2: 24 mmol/L (ref 22–32)
Calcium: 7.9 mg/dL — ABNORMAL LOW (ref 8.9–10.3)
Chloride: 104 mmol/L (ref 98–111)
Creatinine, Ser: 0.76 mg/dL (ref 0.44–1.00)
GFR, Estimated: >60 mL/min (ref >60)
Glucose, Bld: 100 mg/dL — ABNORMAL HIGH (ref 70–99)
Potassium: 3.9 mmol/L (ref 3.5–5.1)
Sodium: 138 mmol/L (ref 135–145)
Total Bilirubin: 0.7 mg/dL (ref 0.3–1.2)
Total Protein: 5.7 g/dL — ABNORMAL LOW (ref 6.5–8.1)

## 2022-09-03 LAB — TYPE AND SCREEN
ABO/RH(D): O NEG
Antibody Screen: POSITIVE

## 2022-09-03 MED ORDER — SIMETHICONE 80 MG PO CHEW
80.0000 mg | CHEWABLE_TABLET | Freq: Three times a day (TID) | ORAL | Status: DC
  Administered 2022-09-03 – 2022-09-04 (×3): 80 mg via ORAL
  Filled 2022-09-03 (×3): qty 1

## 2022-09-03 MED ORDER — PRENATAL MULTIVITAMIN CH
1.0000 | ORAL_TABLET | Freq: Every day | ORAL | Status: DC
  Administered 2022-09-03: 1 via ORAL
  Filled 2022-09-03: qty 1

## 2022-09-03 MED ORDER — WITCH HAZEL-GLYCERIN EX PADS: 1.0000 | MEDICATED_PAD | CUTANEOUS | Status: DC | PRN

## 2022-09-03 MED ORDER — OXYCODONE HCL 5 MG PO TABS: 5.0000 mg | ORAL_TABLET | ORAL | Status: DC | PRN

## 2022-09-03 MED ORDER — ZOLPIDEM TARTRATE 5 MG PO TABS: 5.0000 mg | ORAL_TABLET | Freq: Every evening | ORAL | Status: DC | PRN

## 2022-09-03 MED ORDER — SENNOSIDES-DOCUSATE SODIUM 8.6-50 MG PO TABS
2.0000 | ORAL_TABLET | Freq: Every day | ORAL | Status: DC
  Administered 2022-09-04: 2 via ORAL
  Filled 2022-09-03: qty 2

## 2022-09-03 MED ORDER — COCONUT OIL OIL: 1.0000 | TOPICAL_OIL | Status: DC | PRN

## 2022-09-03 MED ORDER — ACETAMINOPHEN 500 MG PO TABS
1000.0000 mg | ORAL_TABLET | Freq: Four times a day (QID) | ORAL | Status: DC
  Administered 2022-09-03 – 2022-09-04 (×5): 1000 mg via ORAL
  Filled 2022-09-03 (×5): qty 2

## 2022-09-03 MED ORDER — IBUPROFEN 600 MG PO TABS
600.0000 mg | ORAL_TABLET | Freq: Four times a day (QID) | ORAL | Status: DC
  Administered 2022-09-03 – 2022-09-04 (×5): 600 mg via ORAL
  Filled 2022-09-03 (×5): qty 1

## 2022-09-03 MED ORDER — ACETAMINOPHEN 500 MG PO TABS: 1000.0000 mg | ORAL_TABLET | Freq: Four times a day (QID) | ORAL | Status: DC

## 2022-09-03 MED ORDER — SIMETHICONE 80 MG PO CHEW: 80.0000 mg | CHEWABLE_TABLET | ORAL | Status: DC | PRN

## 2022-09-03 MED ORDER — MENTHOL 3 MG MT LOZG: 1.0000 | LOZENGE | OROMUCOSAL | Status: DC | PRN

## 2022-09-03 MED ORDER — DIBUCAINE (PERIANAL) 1 % EX OINT: 1.0000 | TOPICAL_OINTMENT | CUTANEOUS | Status: DC | PRN

## 2022-09-03 MED ORDER — DIPHENHYDRAMINE HCL 25 MG PO CAPS: 25.0000 mg | ORAL_CAPSULE | Freq: Four times a day (QID) | ORAL | Status: DC | PRN

## 2022-09-03 NOTE — Progress Notes (Signed)
Feeling better  Just returned from MRI, report pending  Magnesium sulfate stopped now.  A/:: Appreciate neurology consult       Magnesium now off       Observe tonight pending any recommendations from Dr Cheral Marker that follow

## 2022-09-03 NOTE — Progress Notes (Signed)
POD #3  Vague frontal HA with magnesium, different than HA at home No blurry vision  Today's Vitals   09/03/22 0600 09/03/22 0655 09/03/22 0743 09/03/22 0823  BP: 110/69  121/83 122/83  Pulse: 63  75 77  Resp: 17 16 18 17   Temp:    98.1 F (36.7 C)  TempSrc:    Oral  SpO2:    99%  Weight:      Height:      PainSc: Asleep   3    Body mass index is 26.13 kg/m.   Good UO  Patient up walking in room without difficulty  Abdomen soft, honeycomb dressing C&D DTR 2+  Results for orders placed or performed during the hospital encounter of 09/02/22 (from the past 24 hour(s))  CBC     Status: Abnormal   Collection Time: 09/02/22  9:22 PM  Result Value Ref Range   WBC 8.3 4.0 - 10.5 K/uL   RBC 3.44 (L) 3.87 - 5.11 MIL/uL   Hemoglobin 10.9 (L) 12.0 - 15.0 g/dL   HCT 31.7 (L) 36.0 - 46.0 %   MCV 92.2 80.0 - 100.0 fL   MCH 31.7 26.0 - 34.0 pg   MCHC 34.4 30.0 - 36.0 g/dL   RDW 13.5 11.5 - 15.5 %   Platelets 155 150 - 400 K/uL   nRBC 0.0 0.0 - 0.2 %  Comprehensive metabolic panel     Status: Abnormal   Collection Time: 09/02/22  9:22 PM  Result Value Ref Range   Sodium 139 135 - 145 mmol/L   Potassium 4.0 3.5 - 5.1 mmol/L   Chloride 107 98 - 111 mmol/L   CO2 27 22 - 32 mmol/L   Glucose, Bld 105 (H) 70 - 99 mg/dL   BUN 12 6 - 20 mg/dL   Creatinine, Ser 0.93 0.44 - 1.00 mg/dL   Calcium 8.9 8.9 - 10.3 mg/dL   Total Protein 5.7 (L) 6.5 - 8.1 g/dL   Albumin 2.6 (L) 3.5 - 5.0 g/dL   AST 25 15 - 41 U/L   ALT 15 0 - 44 U/L   Alkaline Phosphatase 160 (H) 38 - 126 U/L   Total Bilirubin 0.6 0.3 - 1.2 mg/dL   GFR, Estimated >60 >60 mL/min   Anion gap 5 5 - 15  Protein / creatinine ratio, urine     Status: Abnormal   Collection Time: 09/02/22  9:56 PM  Result Value Ref Range   Creatinine, Urine 46 mg/dL   Total Protein, Urine 26 mg/dL   Protein Creatinine Ratio 0.57 (H) 0.00 - 0.15 mg/mg[Cre]  Type and screen [facility]     Status: None   Collection Time:  09/02/22 11:52 PM  Result Value Ref Range   ABO/RH(D) O NEG    Antibody Screen POS    Sample Expiration 09/05/2022,2359    Antibody Identification      PASSIVELY ACQUIRED ANTI-D Performed at [facility], [address] [place], [place] [address]   Comprehensive metabolic panel     Status: Abnormal   Collection Time: 09/03/22  5:37 AM  Result Value Ref Range   Sodium 138 135 - 145 mmol/L   Potassium 3.9 3.5 - 5.1 mmol/L   Chloride 104 98 - 111 mmol/L   CO2 24 22 - 32 mmol/L   Glucose, Bld 100 (H) 70 - 99 mg/dL   BUN 10 6 - 20 mg/dL   Creatinine, Ser 0.76 0.44 - 1.00 mg/dL  Calcium 7.9 (L) 8.9 - 10.3 mg/dL   Total Protein 5.7 (L) 6.5 - 8.1 g/dL   Albumin 2.6 (L) 3.5 - 5.0 g/dL   AST 25 15 - 41 U/L   ALT 16 0 - 44 U/L   Alkaline Phosphatase 152 (H) 38 - 126 U/L   Total Bilirubin 0.7 0.3 - 1.2 mg/dL   GFR, Estimated >60 >60 mL/min   Anion gap 10 5 - 15  CBC     Status: Abnormal   Collection Time: 09/03/22  5:37 AM  Result Value Ref Range   WBC 7.7 4.0 - 10.5 K/uL   RBC 3.47 (L) 3.87 - 5.11 MIL/uL   Hemoglobin 10.8 (L) 12.0 - 15.0 g/dL   HCT 31.6 (L) 36.0 - 46.0 %   MCV 91.1 80.0 - 100.0 fL   MCH 31.1 26.0 - 34.0 pg   MCHC 34.2 30.0 - 36.0 g/dL   RDW 13.4 11.5 - 15.5 %   Platelets 152 150 - 400 K/uL   nRBC 0.0 0.0 - 0.2 %    Magnesium sulfate infusing  A/P: PP preeclampsia         D/W magnesium x 24 hours         Continue nifedipine         Probable discharge tomorrow

## 2022-09-03 NOTE — Consult Note (Signed)
NEURO HOSPITALIST CONSULT NOTE   Requesting physician: Dr. Elon Spanner  Reason for Consult: Possible pre-eclamptic headache in a G3P2003 postpartum female with visual blurring and elevated BP following scheduled delivery by cesarean section  History obtained from:  Patient and Chart     HPI:                                                                                                                                          Megan Henson is an 36 y.o. G61P2003 postpartum female presenting with a 2 day history of right frontal and bilateral posterior headache, described as nonthrobbing without significant photophobia, improved with magnesium infusion and associated with bilateral right worse than left blurry vision, also partially improved with magnesium. BP was above 140 on admission and has been normalized with the Mg infusion. Overall, neurological symptoms have improved in parallel with Mg-associated BP normalization. Her headache was initially at 4/10 when admitted and is now rated as 1/10. Of note she does have a PMHx of prothrombin gene mutation, which predisposes to DVT and PE.   Past Medical History:  Diagnosis Date   Prothrombin gene mutation [facility])     Past Surgical History:  Procedure Laterality Date   ARTHROSCOPIC REPAIR ACL Left    CESAREAN SECTION     CESAREAN SECTION WITH BILATERAL TUBAL LIGATION N/A 08/31/2022   Procedure: REPEAT CESAREAN SECTION WITH BILATERAL TUBAL LIGATION;  Surgeon: Marcelle Overlie, MD;  Location: [facility] LD ORS;  Service: Obstetrics;  Laterality: N/A;   FOOT SURGERY Right    TONSILLECTOMY      History reviewed. No pertinent family history.          Social History:  reports that she has never smoked. She has never used smokeless tobacco. She reports that she does not currently use alcohol. She reports that she does not use drugs.  Allergies  Allergen Reactions   Sulfa Antibiotics Rash    MEDICATIONS:                                                                                                                      Prior to Admission:  Medications Prior to Admission  Medication Sig Dispense Refill Last Dose   enoxaparin (LOVENOX) 40 MG/0.4ML injection Inject 0.4 mLs (40 mg  total) into the skin daily. 12 mL 1 09/02/2022   ibuprofen (ADVIL) 600 MG tablet Take 1 tablet (600 mg total) by mouth every 6 (six) hours as needed. 30 tablet 0 09/02/2022   calcium carbonate (TUMS - DOSED IN MG ELEMENTAL CALCIUM) 500 MG chewable tablet Chew 2 tablets by mouth 2 (two) times daily as needed for indigestion or heartburn.      docusate sodium (COLACE) 100 MG capsule Take 1 capsule (100 mg total) by mouth 2 (two) times daily. 60 capsule 2    ferrous sulfate 325 (65 FE) MG EC tablet Take 1 tablet (325 mg total) by mouth 2 (two) times daily. 60 tablet 2    oxyCODONE (OXY IR/ROXICODONE) 5 MG immediate release tablet Take 1 tablet (5 mg total) by mouth every 4 (four) hours as needed for severe pain. 15 tablet 0    Prenatal Vit-Fe Fumarate-FA (MULTIVITAMIN-PRENATAL) 27-0.8 MG TABS tablet Take 1 tablet by mouth in the morning.      Scheduled:  acetaminophen  1,000 mg Oral Q6H   enoxaparin (LOVENOX) injection  40 mg Subcutaneous Q24H   ibuprofen  600 mg Oral Q6H   NIFEdipine  30 mg Oral Daily   prenatal multivitamin  1 tablet Oral Q1200   [START ON 09/04/2022] senna-docusate  2 tablet Oral Daily   simethicone  80 mg Oral TID PC   Continuous:  lactated ringers 10 mL/hr at 09/03/22 0038   magnesium sulfate 1 g/hr (09/03/22 1802)     ROS:                                                                                                                                       As per HPI. Does not endorse any additional neurological symptoms.    Blood pressure 121/76, pulse 89, temperature 98.1 F (36.7 C), temperature source Oral, resp. rate 18, height 5\' 5"  (1.651 m), weight 71.2 kg, SpO2 100 %, currently breastfeeding.   General Examination:                                                                                                        Physical Exam  HEENT-  [place]/AT    Lungs- Respirations unlabored Extremities- Warm and well perfused with no edema.   Neurological Examination Mental Status: Alert, oriented, thought content appropriate.  Speech fluent without evidence of aphasia.  Able to follow all commands without difficulty. Affect euthymic.  Cranial Nerves: II: Visual fields intact in all 4 quadrants  of each eye. No extinction to DSS. PERRL 5 mm >> 3 mm.  20/25 visual acuity OD, 20/20 acuity OS. III,IV, VI: No ptosis. EOMI. No nystagmus. No pain or double vision elicited by eye movement V: Temp sensation equal bilaterally VII: Smile symmetric VIII: Hearing intact to conversation IX,X: No hypophonia or hoarseness XI: Symmetric shoulder shrug XII: Midline tongue extension Motor: Right : Upper extremity   5/5    Left:     Upper extremity   5/5  Lower extremity   5/5     Lower extremity   5/5 Normal tone throughout; no atrophy noted No pronator drift.  Sensory: Temp and light touch intact throughout, bilaterally. No extinction to DSS Deep Tendon Reflexes: 2+ bilateral biceps and brachioradialis. 3+ bilateral patellae. 2+ bilateral achilles. Toes downgoing bilaterally. No asymmetry noted.  Cerebellar: No ataxia with FNF bilaterally Gait: Stands normally with own power. Narrow based stance. Can march in place without unsteadiness or weakness.    Lab Results: Basic Metabolic Panel: Recent Labs  Lab 09/01/22 0505 09/02/22 2122 09/03/22 0537 09/03/22 1858  NA  --  139 138  --   K  --  4.0 3.9  --   CL  --  107 104  --   CO2  --  27 24  --   GLUCOSE  --  105* 100*  --   BUN  --  12 10  --   CREATININE 0.72 0.93 0.76  --   CALCIUM  --  8.9 7.9*  --   MG  --   --   --  6.6*    CBC: Recent Labs  Lab 08/30/22 0914 09/01/22 0505 09/02/22 0607 09/02/22 2122 09/03/22 0537  WBC 7.0 9.3 8.5 8.3 7.7  HGB 12.2  9.3* 10.6* 10.9* 10.8*  HCT 35.3* 27.3* 32.6* 31.7* 31.6*  MCV 90.3 91.0 93.1 92.2 91.1  PLT 119* 100* 127* 155 152    Cardiac Enzymes: No results for input(s): "CKTOTAL", "CKMB", "CKMBINDEX", "TROPONINI" in the last 168 hours.  Lipid Panel: No results for input(s): "CHOL", "TRIG", "HDL", "CHOLHDL", "VLDL", "LDLCALC" in the last 168 hours.  Imaging: No results found.   Assessment: 36 y.o. G29P2003 postpartum female presenting with a 2 day history of right frontal and bilateral posterior headache, described as nonthrobbing without significant photophobia, improved with magnesium infusion and associated with bilateral right worse than left blurry vision, also partially improved with magnesium. BP was above 140 on admission and has been normalized with the Mg infusion. Overall, neurological symptoms have improved in parallel with Mg-associated BP normalization. Of note she does have a PMHx of prothrombin gene mutation, which predisposes to DVT and PE.  - Exam reveals 20/25 visual acuity OD and 20/20 acuity OS, in conjunction with brisk 3+ patellar reflexes.  - DDx for her headache with visual blurring includes pre-eclampsia, cerebral venous sinus thrombosis, migraine headache and tension type headache.   Recommendations: - MRI brain without contrast - MRV brain without contrast - Continue to closely monitor   Electronically signed: Dr. Caryl Pina 09/03/2022, 9:08 PM

## 2022-09-03 NOTE — Progress Notes (Signed)
Megan Henson began to feel foggy. The magnesium was decreased to 1 gm/hr and a magnesium level ordered. About an hour later she states the headache behind her right eye is largely unchanged from admission. She did feel better today but now not as well. Also more blurry vision, especially with reading.  Today's Vitals   09/03/22 1727 09/03/22 1802 09/03/22 1906 09/03/22 1939  BP:    121/76  Pulse:    89  Resp: 17 17 18 18   Temp:    98.1 F (36.7 C)  TempSrc:    Oral  SpO2:    100%  Weight:      Height:      PainSc:       Body mass index is 26.13 kg/m.   Patient sitting up on couch, moving without difficulty.  Face symmetric Lungs CTA Cor RRR DTR 3+ (magnesium sulfate 1 gm/hr)  A/P: PP preeclampsia         D/W patient that her symptoms may be secondary to magnesium infusion. She has great reflexes and a magnesium level is pending         I am concerned that her HA is unilateral. I D/W Dr Cheral Marker, neuro, who will see her this evening.         D/W patient and she agrees.

## 2022-09-04 MED ORDER — NIFEDIPINE ER 30 MG PO TB24: 30.0000 mg | ORAL_TABLET | Freq: Every day | ORAL | 1 refills | Status: DC

## 2022-09-04 NOTE — Progress Notes (Signed)
Feels good  Today's Vitals   09/03/22 2252 09/03/22 2304 09/04/22 0419 09/04/22 0800  BP: 132/81 125/79 112/67   Pulse: 86 81 66   Resp: 18 16 16    Temp: 97.8 F (36.6 C)  98.2 F (36.8 C)   TempSrc: Oral  Oral   SpO2: 100%  100%   Weight:      Height:      PainSc:   4  3    Body mass index is 26.13 kg/m.   IMPRESSION: Normal MRI of the brain.  IMPRESSION: Negative for dural sinus thrombosis  A/P: D/C home         Nifedipine XL 30mg  QD         Lovenox 40mg  QD         FU office 2-3 days

## 2022-09-04 NOTE — Progress Notes (Signed)
Magnesium sulfate off She feels much better. No HA, no vision change  Today's Vitals   09/03/22 2248 09/03/22 2252 09/03/22 2304 09/04/22 0419  BP:  132/81 125/79 112/67  Pulse:  86 81 66  Resp:  18 16 16   Temp:  97.8 F (36.6 C)  98.2 F (36.8 C)  TempSrc:  Oral  Oral  SpO2:  100%  100%  Weight:      Height:      PainSc: 4    4    Body mass index is 26.13 kg/m.   MRI report pending  A/P: PP preeclampsia-S/P magnesium sulfate x 24 hours, now off                                      BP responding well to nifedipine XL 30mg  QD                                      Now asymptomatic           PT mutation- Lovenox 40mg  QD           Neuro evaluation- MR report pending          Will observe and await MR report. Possible discharge today if cleared by neurology.

## 2022-09-04 NOTE — Discharge Summary (Addendum)
Physician Discharge Summary  Patient ID: Megan Henson MRN: [id number] DOB/AGE: 04-20-1986 36 y.o.  Admit date: 09/02/2022 Discharge date: 09/04/2022  Admission Diagnoses:post partum preeclampsia  Discharge Diagnoses:  Post partum preeclampsia  Discharged Condition: good  Hospital Course: 36 yo G3P2 S/P repeat C/S 10/17 23. Pregnancy was complicated by PT mutation and Hx of HELLP with first pregnancy. At that admission her BP and labs were normal and she was discharged on 10/19 23. Later that same day she presented to MAU with C/O HA, blurry vision and elevated BP. Her BP in MAU had a max of 150/72 that responded to Procardia XL 30mg  po. Her labs were normal. Because of HA and blurry vision she is diagnosed with post partum preeclampsia and admitted for magnesium sulfate seizure prophylaxis. Labs remained normal and she is continued on Lovenox 40mg  QD. Later on 09/03/22 she did not feel as well and had a HA persistent primarily behind right eye. The magnesium infusion is reduced from 2 to 1 gm/hr and a magnesium level subsequently returns at 6.6. Neurology consultation is obtained and brain MRI and MRV are ordered. Now magnesium is off and she feels normal. HA and blurry vision have resolved. MRV and MRI reports both return as normal. She will be discharged on Procardia XL 30mg  QD and continue Lovenox 40mg  QD. BP check in office 2-3 days.  Consults: neurology  Significant Diagnostic Studies: labs:  Results for orders placed or performed during the hospital encounter of 09/02/22 (from the past 24 hour(s))  Magnesium     Status: Abnormal   Collection Time: 09/03/22  6:58 PM  Result Value Ref Range   Magnesium 6.6 (HH) 1.7 - 2.4 mg/dL      Treatments: magnesium sulfate infusion, Procardia XL 30mg  QD  Discharge Exam: Blood pressure 112/67, pulse 66, temperature 98.2 F (36.8 C), temperature source Oral, resp. rate 16, height 5\' 5"  (1.651 m), weight 71.2 kg, SpO2 100 %, currently  breastfeeding. General appearance: alert, cooperative, and no distress GI: flat, soft, incision healing well  Disposition: Discharge disposition: 01-Home or Self Care        Allergies as of 09/04/2022       Reactions   Sulfa Antibiotics Rash        Medication List     STOP taking these medications    calcium carbonate 500 MG chewable tablet Commonly known as: TUMS - dosed in mg elemental calcium       TAKE these medications    docusate sodium 100 MG capsule Commonly known as: Colace Take 1 capsule (100 mg total) by mouth 2 (two) times daily.   enoxaparin 40 MG/0.4ML injection Commonly known as: LOVENOX Inject 0.4 mLs (40 mg total) into the skin daily.   ferrous sulfate 325 (65 FE) MG EC tablet Take 1 tablet (325 mg total) by mouth 2 (two) times daily.   ibuprofen 600 MG tablet Commonly known as: ADVIL Take 1 tablet (600 mg total) by mouth every 6 (six) hours as needed.   multivitamin-prenatal 27-0.8 MG Tabs tablet Take 1 tablet by mouth in the morning.   NIFEdipine 30 MG 24 hr tablet Commonly known as: ADALAT CC Take 1 tablet (30 mg total) by mouth daily.   oxyCODONE 5 MG immediate release tablet Commonly known as: Oxy IR/ROXICODONE Take 1 tablet (5 mg total) by mouth every 4 (four) hours as needed for severe pain.         Signed: Shon Millet II 09/04/2022, 8:50 AM

## 2022-09-14 ENCOUNTER — Telehealth (HOSPITAL_COMMUNITY): Payer: Self-pay | Admitting: *Deleted

## 2022-09-14 NOTE — Telephone Encounter (Signed)
Hospital Discharge Follow-Up Call:  Patient reports that she is doing well.  She has seen her OB twice since being discharged after PP readmission and she is now off of antihypertensive medication per OB instruction.  She has no concerns about her healing process from her birth.  EPDS today was 3 and she endorses this accurately reflects that she is doing well emotionally.  Patient says that baby is well and she has no concerns about baby's health.  She reports that baby sleeps in a bassinet in parent's bedroom.  Reviewed ABCs of Safe Sleep.

## 2022-09-18 ENCOUNTER — Inpatient Hospital Stay (HOSPITAL_COMMUNITY)
Admission: AD | Admit: 2022-09-18 | Discharge: 2022-09-18 | Disposition: A | Discharge status: Home / Self Care | Payer: BC Managed Care – PPO | Attending: Obstetrics and Gynecology | Admitting: Obstetrics and Gynecology

## 2022-09-18 ENCOUNTER — Other Ambulatory Visit: Payer: Self-pay

## 2022-09-18 DIAGNOSIS — Z4889 Encounter for other specified surgical aftercare: Secondary | ICD-10-CM | POA: Diagnosis not present

## 2022-09-18 NOTE — MAU Note (Signed)
Megan Henson is a 36 y.o. at Unknown here in MAU reporting: she's here for an incision check.  States she's 18 days post Cesarean Section and incision on right side opened today.  Denies drainage LMP: N/A Onset of complaint: today Pain score: 3 Vitals:   09/18/22 1550  BP: 122/84  Pulse: 81  Resp: 18  Temp: 98.4 F (36.9 C)  SpO2: 97%     FHT:NA Lab orders placed from triage:   None

## 2022-09-18 NOTE — MAU Provider Note (Signed)
History     CSN: [id number]  Arrival date and time: 09/18/22 1530   None     Chief Complaint  Patient presents with   Incision Check   Patient presenting for wound check.  Reports that she was in the shower today and noticed the scab came off of the right side of her cesarean section incision and she noticed it looked a little open.  She called her OB provider who recommended evaluation MAU.  Denies any pain or bleeding there.  Is otherwise doing well.  C-section occurred 08/31/2022.    OB History     Gravida  3   Para  3   Term  2   Preterm      AB      Living  3      SAB      IAB      Ectopic      Multiple  0   Live Births  3           Past Medical History:  Diagnosis Date   Prothrombin gene mutation ([place])     Past Surgical History:  Procedure Laterality Date   ARTHROSCOPIC REPAIR ACL Left    CESAREAN SECTION     CESAREAN SECTION WITH BILATERAL TUBAL LIGATION N/A 08/31/2022   Procedure: REPEAT CESAREAN SECTION WITH BILATERAL TUBAL LIGATION;  Surgeon: Dian Queen, MD;  Location: [facility] LD ORS;  Service: Obstetrics;  Laterality: N/A;   FOOT SURGERY Right    TONSILLECTOMY      No family history on file.  Social History   Tobacco Use   Smoking status: Never   Smokeless tobacco: Never  Vaping Use   Vaping Use: Never used  Substance Use Topics   Alcohol use: Not Currently   Drug use: Never    Allergies:  Allergies  Allergen Reactions   Sulfa Antibiotics Rash    Medications Prior to Admission  Medication Sig Dispense Refill Last Dose   docusate sodium (COLACE) 100 MG capsule Take 1 capsule (100 mg total) by mouth 2 (two) times daily. 60 capsule 2    enoxaparin (LOVENOX) 40 MG/0.4ML injection Inject 0.4 mLs (40 mg total) into the skin daily. 12 mL 1    ferrous sulfate 325 (65 FE) MG EC tablet Take 1 tablet (325 mg total) by mouth 2 (two) times daily. 60 tablet 2    ibuprofen (ADVIL) 600 MG tablet Take 1 tablet (600 mg total) by mouth  every 6 (six) hours as needed. 30 tablet 0    NIFEdipine (ADALAT CC) 30 MG 24 hr tablet Take 1 tablet (30 mg total) by mouth daily. 30 tablet 1    oxyCODONE (OXY IR/ROXICODONE) 5 MG immediate release tablet Take 1 tablet (5 mg total) by mouth every 4 (four) hours as needed for severe pain. 15 tablet 0    Prenatal Vit-Fe Fumarate-FA (MULTIVITAMIN-PRENATAL) 27-0.8 MG TABS tablet Take 1 tablet by mouth in the morning.       Physical Exam   Blood pressure 122/84, pulse 81, temperature 98.4 F (36.9 C), temperature source Oral, resp. rate 18, height 5\' 5"  (1.651 m), weight 67.8 kg, SpO2 97 %, currently breastfeeding.  Physical Exam Vitals reviewed.  Constitutional:      Appearance: She is normal weight.  HENT:     Head: Normocephalic and atraumatic.  Eyes:     Extraocular Movements: Extraocular movements intact.  Cardiovascular:     Rate and Rhythm: Normal rate.  Pulmonary:  Effort: Pulmonary effort is normal.  Abdominal:     General: Abdomen is flat.     Palpations: Abdomen is soft.     Tenderness: There is no abdominal tenderness.  Skin:    General: Skin is warm.     Capillary Refill: Capillary refill takes less than 2 seconds.     Findings: Lesion (Small opening in right edges of the skin incision from cesarean section.  No erythema, warmth.  See image below.) present.  Neurological:     Mental Status: She is alert.     MAU Course  Procedures  MDM 2+ weeks postpartum.  Wound evaluation.  Assessment and Plan  36 year old presenting for wound evaluation s/p cesarean section on 10/17.  Noticed that the wound was opening a little bit today while in the shower.  No increased pain, warmth, excessive drainage from the wound.  2 Steri-Strips applied and patient discharged to follow-up with OB provider.  No further questions or concerns.  Celedonio Savage 09/18/2022, 4:00 PM

## 2022-09-18 NOTE — Discharge Instructions (Addendum)
It was wonderful seeing you today!  I am sorry you are having issues with your wound.  It does appear to be healing well and does not look infected.  Place some Steri-Strips.  I would keep it covered and keep it dry when able.  Do not excessively scrub it while in the shower.  I would like for you to call your OB office and schedule a wound check appointment in approximately a week.  If you notice it is opening more you can be reevaluated here and we can place some Steri-Strips or you can go to your OB office.  If you notice any increased drainage, warmth, erythema around the incision please be reevaluated.  I hope you have a wonderful day!

## 2022-09-18 NOTE — Progress Notes (Signed)
Dr. Caron Presume into see pt and eval incision.  Incision assessed and MD reports incision is healing well.

## 2022-10-05 DIAGNOSIS — Z1389 Encounter for screening for other disorder: Secondary | ICD-10-CM | POA: Diagnosis not present

## 2022-10-05 DIAGNOSIS — Z809 Family history of malignant neoplasm, unspecified: Secondary | ICD-10-CM | POA: Diagnosis not present

## 2022-10-05 DIAGNOSIS — N760 Acute vaginitis: Secondary | ICD-10-CM | POA: Diagnosis not present

## 2022-11-03 DIAGNOSIS — N766 Ulceration of vulva: Secondary | ICD-10-CM | POA: Diagnosis not present

## 2022-11-03 DIAGNOSIS — R300 Dysuria: Secondary | ICD-10-CM | POA: Diagnosis not present

## 2022-11-03 DIAGNOSIS — N898 Other specified noninflammatory disorders of vagina: Secondary | ICD-10-CM | POA: Diagnosis not present

## 2022-12-20 ENCOUNTER — Ambulatory Visit: Payer: BC Managed Care – PPO | Admitting: Orthopedic Surgery

## 2023-05-04 ENCOUNTER — Encounter: Payer: Self-pay | Admitting: Gastroenterology

## 2023-06-29 ENCOUNTER — Encounter: Payer: Self-pay | Admitting: Gastroenterology

## 2023-06-29 ENCOUNTER — Ambulatory Visit (INDEPENDENT_AMBULATORY_CARE_PROVIDER_SITE_OTHER): Payer: 59 | Admitting: Gastroenterology

## 2023-06-29 ENCOUNTER — Telehealth: Payer: Self-pay | Admitting: Gastroenterology

## 2023-06-29 VITALS — BP 110/68 | HR 65 | Ht 65.0 in | Wt 148.0 lb

## 2023-06-29 DIAGNOSIS — Z1211 Encounter for screening for malignant neoplasm of colon: Secondary | ICD-10-CM

## 2023-06-29 DIAGNOSIS — Z83719 Family history of colon polyps, unspecified: Secondary | ICD-10-CM | POA: Diagnosis not present

## 2023-06-29 NOTE — Progress Notes (Signed)
Chief Complaint: Screening colonoscopy Primary GI MD: Gentry Fitz  HPI: 37 year old female with medical history as listed below presents to discuss obtaining a screening colonoscopy.  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  Due to this after her C-sections she has had to have 6 days of Lovenox.   Patient states she is here today because her mother (retired Pensions consultant) became aware of a research study showing that patients who used Delalutin during pregnancy have substantial increased risk of certain cancers in their offspring.  Patient states her mother took this medication throughout the duration of most of her pregnancy (20 weeks) and told her and her 2 brothers to get colonoscopies early as the risk of colon cancer is 5 times the average person due to her mother using this medication while she was pregnant them.  Patient states one of her brothers had a colonoscopy age 58 and found precancerous polyps with a repeat recommended every 5 years.  Patient denies GI symptoms.  States she will occasionally have diarrhea or constipation based on something that disagrees with her but overall has normal bowel movements without bleeding.  Denies weight loss.  Denies family history of colon cancer.   PREVIOUS GI WORKUP   none  Past Medical History:  Diagnosis Date   Prothrombin gene mutation [facility])     Past Surgical History:  Procedure Laterality Date   ARTHROSCOPIC REPAIR ACL Left    CESAREAN SECTION     CESAREAN SECTION WITH BILATERAL TUBAL LIGATION N/A 08/31/2022   Procedure: REPEAT CESAREAN SECTION WITH BILATERAL TUBAL LIGATION;  Surgeon: Marcelle Overlie, MD;  Location: [facility] LD [facility]S;  Service: Obstetrics;  Laterality: N/A;   FOOT SURGERY Right    TONSILLECTOMY      No current outpatient medications on file.   No current facility-administered medications for this visit.    Allergies as of 06/29/2023 - Review Complete 06/29/2023  Allergen Reaction Noted   Sulfa antibiotics  Rash 08/19/2022    Family History  Problem Relation Age of Onset   Breast cancer Mother    Liver cancer Neg Hx    Colon cancer Neg Hx    Esophageal cancer Neg Hx     Social History   Socioeconomic History   Marital status: Married    Spouse name: Not on file   Number of children: 3   Years of education: Not on file   Highest education level: Not on file  Occupational History   Occupation: marketing  Tobacco Use   Smoking status: Never   Smokeless tobacco: Never  Vaping Use   Vaping status: Never Used  Substance and Sexual Activity   Alcohol use: Not Currently    Comment: wine   Drug use: Never   Sexual activity: Yes  Other Topics Concern   Not on file  Social History Narrative   Not on file   Social Determinants of Health   Financial Resource Strain: Not on file  Food Insecurity: No Food Insecurity (09/03/2022)   Hunger Vital Sign    Worried About Running Out of Food in the Last Year: Never true    Ran Out of Food in the Last Year: Never true  Transportation Needs: No Transportation Needs (09/03/2022)   PRAPARE - Administrator, Civil Service (Medical): No    Lack of Transportation (Non-Medical): No  Physical Activity: Not on file  Stress: Not on file  Social Connections: Not on file  Intimate Partner Violence: Not At Risk (09/03/2022)  Humiliation, Afraid, Rape, and Kick questionnaire    Fear of Current or Ex-Partner: No    Emotionally Abused: No    Physically Abused: No    Sexually Abused: No    Review of Systems:    Constitutional: No weight loss, fever, chills, weakness or fatigue HEENT: Eyes: No change in vision               Ears, Nose, Throat:  No change in hearing or congestion Skin: No rash or itching Cardiovascular: No chest pain, chest pressure or palpitations   Respiratory: No SOB or cough Gastrointestinal: See HPI and otherwise negative Genitourinary: No dysuria or change in urinary frequency Neurological: No headache,  dizziness or syncope Musculoskeletal: No new muscle or joint pain Hematologic: No bleeding or bruising Psychiatric: No history of depression or anxiety    Physical Exam:  Vital signs: BP 110/68   Pulse 65   Ht 5\' 5"  (1.651 m)   Wt 67.1 kg   BMI 24.63 kg/m   Constitutional: NAD, Well developed, Well nourished, alert and cooperative Head:  Normocephalic and atraumatic. Eyes:   PEERL, EOMI. No icterus. Conjunctiva pink. Respiratory: Respirations even and unlabored. Lungs clear to auscultation bilaterally.   No wheezes, crackles, or rhonchi.  Cardiovascular:  Regular rate and rhythm. No peripheral edema, cyanosis or pallor.  Gastrointestinal:  Soft, nondistended, nontender. No rebound or guarding. Normal bowel sounds. No appreciable masses or hepatomegaly. Rectal:  Not performed.  Msk:  Symmetrical without gross deformities. Without edema, no deformity or joint abnormality.  Neurologic:  Alert and  oriented x4;  grossly normal neurologically.  Skin:   Dry and intact without significant lesions or rashes. Psychiatric: Oriented to person, place and time. Demonstrates good judgement and reason without abnormal affect or behaviors.   RELEVANT LABS AND IMAGING: CBC    Component Value Date/Time   WBC 7.7 09/03/2022 0537   RBC 3.47 (L) 09/03/2022 0537   HGB 10.8 (L) 09/03/2022 0537   HCT 31.6 (L) 09/03/2022 0537   PLT 152 09/03/2022 0537   MCV 91.1 09/03/2022 0537   MCH 31.1 09/03/2022 0537   MCHC 34.2 09/03/2022 0537   RDW 13.4 09/03/2022 0537    CMP     Component Value Date/Time   NA 138 09/03/2022 0537   K 3.9 09/03/2022 0537   CL 104 09/03/2022 0537   CO2 24 09/03/2022 0537   GLUCOSE 100 (H) 09/03/2022 0537   BUN 10 09/03/2022 0537   CREATININE 0.76 09/03/2022 0537   CALCIUM 7.9 (L) 09/03/2022 0537   PROT 5.7 (L) 09/03/2022 0537   ALBUMIN 2.6 (L) 09/03/2022 0537   AST 25 09/03/2022 0537   ALT 16 09/03/2022 0537   ALKPHOS 152 (H) 09/03/2022 0537   BILITOT 0.7  09/03/2022 0537   GFRNONAA >60 09/03/2022 0537     Assessment/Plan:   Family history of colonic polyps Mother with use of Delalutin during pregnancy  Based on first degree relative with colon polyps under age 13 patient would be due for her screening colonoscopy.  Upon further review of research studies of the use of Delalutin during pregnancy, it does appear that offspring are at a higher risk for certain cancers such as colon cancer --- Schedule colonoscopy --- I thoroughly discussed the procedure with the patient (at bedside) to include nature of the procedure, alternatives, benefits, and risks (including but not limited to bleeding, infection, perforation, anesthesia/cardiac pulmonary complications).  Patient verbalized understanding and gave verbal consent to proceed with procedure.  ---  follow up per procedure  Boone Master, PA-C [facility] 06/29/2023, 10:43 AM  Cc: Garlan Fillers, MD

## 2023-06-29 NOTE — Telephone Encounter (Signed)
Contacted pt & pt's new instructions were sent via MyChart.

## 2023-06-29 NOTE — Patient Instructions (Addendum)
You have been scheduled for a colonoscopy. Please follow written instructions given to you at your visit today.   Please pick up your prep supplies at the pharmacy within the next 1-3 days.  If you use inhalers (even only as needed), please bring them with you on the day of your procedure. ____________________________________________________  Follow up as needed.  Due to recent changes in healthcare laws, you may see the results of your imaging and laboratory studies on MyChart before your provider has had a chance to review them.  We understand that in some cases there may be results that are confusing or concerning to you. Not all laboratory results come back in the same time frame and the provider may be waiting for multiple results in order to interpret others.  Please give [place] 48 hours in order for your provider to thoroughly review all the results before contacting the office for clarification of your results.   Thank you for trusting me with your gastrointestinal care!

## 2023-06-29 NOTE — Telephone Encounter (Signed)
PT had to reschedule colonoscopy and needs to have new prep instruction with the correct times. Please advise.

## 2023-07-03 NOTE — Progress Notes (Signed)
Agree with the assessment and plan as outlined by Boone Master, PA-C.  As far as I can tell, this increased risk in colorectal cancer was based on the results of one study.  More research needs to confirm these findings, but certainly the results are concerning for an increased CRC risk and an early screening colonoscopy is not unreasonable, especially given that her brother had polyps in his 30s.

## 2023-08-10 ENCOUNTER — Encounter: Payer: 59 | Admitting: Gastroenterology

## 2023-08-24 ENCOUNTER — Encounter: Payer: 59 | Admitting: Gastroenterology

## 2023-08-26 ENCOUNTER — Encounter: Payer: Self-pay | Admitting: Gastroenterology

## 2023-09-05 ENCOUNTER — Telehealth: Payer: Self-pay | Admitting: Gastroenterology

## 2023-09-05 NOTE — Telephone Encounter (Signed)
Inbound call from patient requesting to have updated prep instructions for 10/24 colonoscopy sent in mychart. Please advise, thank you.

## 2023-09-08 ENCOUNTER — Ambulatory Visit (AMBULATORY_SURGERY_CENTER): Payer: 59 | Admitting: Gastroenterology

## 2023-09-08 ENCOUNTER — Encounter: Payer: Self-pay | Admitting: Gastroenterology

## 2023-09-08 VITALS — BP 98/58 | HR 47 | Temp 97.3°F | Resp 18 | Ht 65.0 in | Wt 148.0 lb

## 2023-09-08 DIAGNOSIS — Z1211 Encounter for screening for malignant neoplasm of colon: Secondary | ICD-10-CM

## 2023-09-08 DIAGNOSIS — Z83719 Family history of colon polyps, unspecified: Secondary | ICD-10-CM

## 2023-09-08 MED ORDER — SODIUM CHLORIDE 0.9 % IV SOLN: 500.0000 mL | Freq: Once | INTRAVENOUS | Status: DC

## 2023-09-08 NOTE — Progress Notes (Signed)
Uneventful anesthetic. Report to pacu rn. Vss. Care resumed by rn. 

## 2023-09-08 NOTE — Progress Notes (Signed)
[facility] History and Physical   Primary Care Physician:  Garlan Fillers, MD   Reason for Procedure:   Family history of polyps  Plan:    Colonoscopy     HPI: Megan Henson is a 37 y.o. female undergoing colonoscopy because her brother was found to have colon polyps at age 2.  Her mother took a medication (Delalutin) while she was pregnant that has been linked to an increased risk of cancers in children exposed to the drug in utero.  She has no family history of colon cancer and no chronic GI symptoms.    Past Medical History:  Diagnosis Date   Prothrombin gene mutation [facility])     Past Surgical History:  Procedure Laterality Date   ARTHROSCOPIC REPAIR ACL Left    CESAREAN SECTION     CESAREAN SECTION WITH BILATERAL TUBAL LIGATION N/A 08/31/2022   Procedure: REPEAT CESAREAN SECTION WITH BILATERAL TUBAL LIGATION;  Surgeon: Marcelle Overlie, MD;  Location: [facility] LD ORS;  Service: Obstetrics;  Laterality: N/A;   FOOT SURGERY Right    TONSILLECTOMY      Prior to Admission medications   Not on File    No current outpatient medications on file.   Current Facility-Administered Medications  Medication Dose Route Frequency Provider Last Rate Last Admin   0.9 %  sodium chloride infusion  500 mL Intravenous Once Jenel Lucks, MD        Allergies as of 09/08/2023 - Review Complete 09/08/2023  Allergen Reaction Noted   Sulfa antibiotics Rash 08/19/2022    Family History  Problem Relation Age of Onset   Breast cancer Mother    Liver cancer Neg Hx    Colon cancer Neg Hx    Esophageal cancer Neg Hx    Rectal cancer Neg Hx    Stomach cancer Neg Hx     Social History   Socioeconomic History   Marital status: Married    Spouse name: Not on file   Number of children: 3   Years of education: Not on file   Highest education level: Not on file  Occupational History   Occupation: marketing  Tobacco Use   Smoking status: Never   Smokeless tobacco:  Never  Vaping Use   Vaping status: Never Used  Substance and Sexual Activity   Alcohol use: Not Currently    Comment: wine   Drug use: Never   Sexual activity: Yes  Other Topics Concern   Not on file  Social History Narrative   Not on file   Social Determinants of Health   Financial Resource Strain: Not on file  Food Insecurity: No Food Insecurity (09/03/2022)   Hunger Vital Sign    Worried About Running Out of Food in the Last Year: Never true    Ran Out of Food in the Last Year: Never true  Transportation Needs: No Transportation Needs (09/03/2022)   PRAPARE - Administrator, Civil Service (Medical): No    Lack of Transportation (Non-Medical): No  Physical Activity: Not on file  Stress: Not on file  Social Connections: Not on file  Intimate Partner Violence: Not At Risk (10/28/2022)   Received from [facility]    Threatened: Not on file    Insulted: Not on file    Physically Hurt : Not on file    Scream: Not on file    Review of Systems:  All other review of systems negative except as mentioned in the HPI.  Physical Exam: Vital signs BP 108/71   Pulse (!) 55   Temp (!) 97.3 F (36.3 C)   Ht 5\' 5"  (1.651 m)   Wt 148 lb (67.1 kg)   LMP 09/02/2023   SpO2 99%   BMI 24.63 kg/m   General:   Alert,  Well-developed, well-nourished, pleasant and cooperative in NAD Airway:  Mallampati 1 Lungs:  Clear throughout to auscultation.   Heart:  Regular rate and rhythm; no murmurs, clicks, rubs,  or gallops. Abdomen:  Soft, nontender and nondistended. Normal bowel sounds.   Neuro/Psych:  Normal mood and affect. A and O x 3   Megan Henson E. Tomasa Rand, MD [facility]

## 2023-09-08 NOTE — Op Note (Signed)
[facility] Patient Name: Megan Henson Procedure Date: 09/08/2023 11:14 AM MRN: [id number] Endoscopist: Lorin Picket E. Tomasa Rand , MD, [id number] Age: 37 Referring MD:  Date of Birth: 04-Nov-1986 Gender: Female Account #: [id number] Procedure:                Colonoscopy Indications:              Colon cancer screening in patient at increased                            risk: Family history of 1st-degree relative with                            colon polyps before age 49 years (brother, 27), in                            utero exposure to possibly carcinogenic medication                            (delalutin) Medicines:                Monitored Anesthesia Care Procedure:                Pre-Anesthesia Assessment:                           - Prior to the procedure, a History and Physical                            was performed, and patient medications and                            allergies were reviewed. The patient's tolerance of                            previous anesthesia was also reviewed. The risks                            and benefits of the procedure and the sedation                            options and risks were discussed with the patient.                            All questions were answered, and informed consent                            was obtained. Prior Anticoagulants: The patient has                            taken no anticoagulant or antiplatelet agents. ASA                            Grade Assessment: I - A normal, healthy patient.  After reviewing the risks and benefits, the patient                            was deemed in satisfactory condition to undergo the                            procedure.                           After obtaining informed consent, the colonoscope                            was passed under direct vision. Throughout the                            procedure, the patient's blood pressure, pulse, and                             oxygen saturations were monitored continuously. The                            Olympus Scope SN: [id number] was introduced through                            the anus and advanced to the the cecum, identified                            by appendiceal orifice and ileocecal valve. The                            colonoscopy was performed without difficulty. The                            patient tolerated the procedure well. The quality                            of the bowel preparation was excellent. The                            ileocecal valve, appendiceal orifice, and rectum                            were photographed. The bowel preparation used was                            Plenvu via split dose instruction. Scope In: 11:30:57 AM Scope Out: 11:46:47 AM Scope Withdrawal Time: 0 hours 9 minutes 25 seconds  Total Procedure Duration: 0 hours 15 minutes 50 seconds  Findings:                 The perianal and digital rectal examinations were                            normal. Pertinent negatives include normal  sphincter tone and no palpable rectal lesions.                           The colon (entire examined portion) appeared normal.                           The retroflexed view of the distal rectum and anal                            verge was normal and showed no anal or rectal                            abnormalities. Complications:            No immediate complications. Estimated Blood Loss:     Estimated blood loss: none. Impression:               - The entire examined colon is normal.                           - The distal rectum and anal verge are normal on                            retroflexion view.                           - No specimens collected. Recommendation:           - Patient has a contact number available for                            emergencies. The signs and symptoms of potential                            delayed  complications were discussed with the                            patient. Return to normal activities tomorrow.                            Written discharge instructions were provided to the                            patient.                           - Resume previous diet.                           - Repeat colonoscopy in 10 years for screening                            purposes. Omarii Scalzo E. Tomasa Rand, MD 09/08/2023 11:53:51 AM This report has been signed electronically.

## 2023-09-08 NOTE — Patient Instructions (Signed)
Resume previous diet Continue present medications Await pathology results  Handouts/information given for polyps, diverticulosis and hemorrhoids  YOU HAD AN ENDOSCOPIC PROCEDURE TODAY AT THE [facility]:   Refer to the procedure report that was given to you for any specific questions about what was found during the examination.  If the procedure report does not answer your questions, please call your gastroenterologist to clarify.  If you requested that your care partner not be given the details of your procedure findings, then the procedure report has been included in a sealed envelope for you to review at your convenience later.  YOU SHOULD EXPECT: Some feelings of bloating in the abdomen. Passage of more gas than usual.  Walking can help get rid of the air that was put into your GI tract during the procedure and reduce the bloating. If you had a lower endoscopy (such as a colonoscopy or flexible sigmoidoscopy) you may notice spotting of blood in your stool or on the toilet paper. If you underwent a bowel prep for your procedure, you may not have a normal bowel movement for a few days.  Please Note:  You might notice some irritation and congestion in your nose or some drainage.  This is from the oxygen used during your procedure.  There is no need for concern and it should clear up in a day or so.  SYMPTOMS TO REPORT IMMEDIATELY:  Following lower endoscopy (colonoscopy):  Excessive amounts of blood in the stool  Significant tenderness or worsening of abdominal pains  Swelling of the abdomen that is new, acute  Fever of 100F or higher  For urgent or emergent issues, a gastroenterologist can be reached at any hour by calling [phone]. Do not use MyChart messaging for urgent concerns.   DIET:  We do recommend a small meal at first, but then you may proceed to your regular diet.  Drink plenty of fluids but you should avoid alcoholic beverages for 24 hours.  ACTIVITY:  You  should plan to take it easy for the rest of today and you should NOT DRIVE or use heavy machinery until tomorrow (because of the sedation medicines used during the test).    FOLLOW UP: Our staff will call the number listed on your records the next business day following your procedure.  We will call around 7:15- 8:00 am to check on you and address any questions or concerns that you may have regarding the information given to you following your procedure. If we do not reach you, we will leave a message.     If any biopsies were taken you will be contacted by phone or by letter within the next 1-3 weeks.  Please call us at [phone] if you have not heard about the biopsies in 3 weeks.    SIGNATURES/CONFIDENTIALITY: You and/or your care partner have signed paperwork which will be entered into your electronic medical record.  These signatures attest to the fact that that the information above on your After Visit Summary has been reviewed and is understood.  Full responsibility of the confidentiality of this discharge information lies with you and/or your care-partner. 

## 2023-09-09 ENCOUNTER — Telehealth: Payer: Self-pay

## 2023-09-09 NOTE — Telephone Encounter (Signed)
  Follow up Call-     09/08/2023   10:49 AM  Call back number  Post procedure Call Back phone  # [phone]  Permission to leave phone message Yes     Patient questions:  Do you have a fever, pain , or abdominal swelling? No. Pain Score  0 *  Have you tolerated food without any problems? Yes.    Have you been able to return to your normal activities? Yes.    Do you have any questions about your discharge instructions: Diet   No. Medications  No. Follow up visit  No.  Do you have questions or concerns about your Care? No.  Actions: * If pain score is 4 or above: No action needed, pain <4.

## 2024-09-20 ENCOUNTER — Other Ambulatory Visit: Payer: Self-pay | Admitting: Obstetrics and Gynecology

## 2024-09-20 DIAGNOSIS — N6321 Unspecified lump in the left breast, upper outer quadrant: Secondary | ICD-10-CM

## 2024-09-25 ENCOUNTER — Ambulatory Visit
Admission: RE | Admit: 2024-09-25 | Discharge: 2024-09-25 | Disposition: A | Discharge status: Home / Self Care | Source: Ambulatory Visit | Attending: Obstetrics and Gynecology | Admitting: Obstetrics and Gynecology

## 2024-09-25 DIAGNOSIS — N6321 Unspecified lump in the left breast, upper outer quadrant: Secondary | ICD-10-CM
